# Patient Record
Sex: Female | Born: 1970 | Race: White | Hispanic: No | Marital: Married | Smoking: Current every day smoker
Health system: Southern US, Community
[De-identification: ages and names within clinical notes are randomized; demographics above are authoritative.]

## PROBLEM LIST (undated history)

## (undated) DIAGNOSIS — F419 Anxiety disorder, unspecified: Secondary | ICD-10-CM

## (undated) DIAGNOSIS — F32A Depression, unspecified: Secondary | ICD-10-CM

## (undated) DIAGNOSIS — E041 Nontoxic single thyroid nodule: Secondary | ICD-10-CM

## (undated) DIAGNOSIS — T4145XA Adverse effect of unspecified anesthetic, initial encounter: Secondary | ICD-10-CM

## (undated) DIAGNOSIS — F329 Major depressive disorder, single episode, unspecified: Secondary | ICD-10-CM

## (undated) DIAGNOSIS — K219 Gastro-esophageal reflux disease without esophagitis: Secondary | ICD-10-CM

## (undated) DIAGNOSIS — R9389 Abnormal findings on diagnostic imaging of other specified body structures: Secondary | ICD-10-CM

## (undated) DIAGNOSIS — K589 Irritable bowel syndrome without diarrhea: Secondary | ICD-10-CM

## (undated) DIAGNOSIS — T8859XA Other complications of anesthesia, initial encounter: Secondary | ICD-10-CM

## (undated) HISTORY — PX: TONSILLECTOMY AND ADENOIDECTOMY: SHX28

---
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
## 2007-05-12 ENCOUNTER — Observation Stay: Payer: Self-pay | Admitting: Obstetrics and Gynecology

## 2007-05-27 ENCOUNTER — Observation Stay: Payer: Self-pay | Admitting: Obstetrics and Gynecology

## 2007-06-23 ENCOUNTER — Observation Stay: Payer: Self-pay | Admitting: Obstetrics and Gynecology

## 2007-06-26 ENCOUNTER — Inpatient Hospital Stay: Payer: Self-pay | Admitting: Obstetrics and Gynecology

## 2010-12-25 HISTORY — PX: CHOLECYSTECTOMY: SHX55

## 2012-06-08 ENCOUNTER — Ambulatory Visit: Payer: Self-pay | Admitting: Family Medicine

## 2016-05-15 ENCOUNTER — Emergency Department: Payer: BLUE CROSS/BLUE SHIELD

## 2016-05-15 ENCOUNTER — Ambulatory Visit
Admission: EM | Admit: 2016-05-15 | Discharge: 2016-05-15 | Payer: BLUE CROSS/BLUE SHIELD | Attending: Family Medicine | Admitting: Family Medicine

## 2016-05-15 ENCOUNTER — Encounter: Payer: Self-pay | Admitting: *Deleted

## 2016-05-15 ENCOUNTER — Encounter: Payer: Self-pay | Admitting: Radiology

## 2016-05-15 ENCOUNTER — Emergency Department
Admission: EM | Admit: 2016-05-15 | Discharge: 2016-05-15 | Disposition: A | Discharge status: Home / Self Care | Payer: BLUE CROSS/BLUE SHIELD | Attending: Emergency Medicine | Admitting: Emergency Medicine

## 2016-05-15 DIAGNOSIS — R519 Headache, unspecified: Secondary | ICD-10-CM

## 2016-05-15 DIAGNOSIS — F17200 Nicotine dependence, unspecified, uncomplicated: Secondary | ICD-10-CM | POA: Diagnosis not present

## 2016-05-15 DIAGNOSIS — R079 Chest pain, unspecified: Secondary | ICD-10-CM | POA: Insufficient documentation

## 2016-05-15 DIAGNOSIS — R51 Headache: Secondary | ICD-10-CM | POA: Diagnosis not present

## 2016-05-15 DIAGNOSIS — F419 Anxiety disorder, unspecified: Secondary | ICD-10-CM | POA: Diagnosis not present

## 2016-05-15 DIAGNOSIS — F329 Major depressive disorder, single episode, unspecified: Secondary | ICD-10-CM | POA: Insufficient documentation

## 2016-05-15 DIAGNOSIS — Z8249 Family history of ischemic heart disease and other diseases of the circulatory system: Secondary | ICD-10-CM | POA: Diagnosis not present

## 2016-05-15 DIAGNOSIS — R42 Dizziness and giddiness: Secondary | ICD-10-CM | POA: Diagnosis present

## 2016-05-15 DIAGNOSIS — K589 Irritable bowel syndrome without diarrhea: Secondary | ICD-10-CM | POA: Diagnosis not present

## 2016-05-15 HISTORY — DX: Anxiety disorder, unspecified: F41.9

## 2016-05-15 HISTORY — DX: Irritable bowel syndrome, unspecified: K58.9

## 2016-05-15 HISTORY — DX: Major depressive disorder, single episode, unspecified: F32.9

## 2016-05-15 HISTORY — DX: Depression, unspecified: F32.A

## 2016-05-15 LAB — BASIC METABOLIC PANEL
Anion gap: 7 (ref 5–15)
BUN: 21 mg/dL — ABNORMAL HIGH (ref 6–20)
CO2: 26 mmol/L (ref 22–32)
Calcium: 8.9 mg/dL (ref 8.9–10.3)
Chloride: 103 mmol/L (ref 101–111)
Creatinine, Ser: 0.9 mg/dL (ref 0.44–1.00)
GFR calc Af Amer: >60 mL/min (ref >60)
GFR calc non Af Amer: >60 mL/min (ref >60)
Glucose, Bld: 92 mg/dL (ref 65–99)
Potassium: 3.8 mmol/L (ref 3.5–5.1)
Sodium: 136 mmol/L (ref 135–145)

## 2016-05-15 LAB — CBC
HCT: 41.7 % (ref 35.0–47.0)
Hemoglobin: 13.7 g/dL (ref 12.0–16.0)
MCH: 27.5 pg (ref 26.0–34.0)
MCHC: 32.8 g/dL (ref 32.0–36.0)
MCV: 83.8 fL (ref 80.0–100.0)
Platelets: 211 10*3/uL (ref 150–440)
RBC: 4.98 MIL/uL (ref 3.80–5.20)
RDW: 15.2 % — ABNORMAL HIGH (ref 11.5–14.5)
WBC: 10.9 10*3/uL (ref 3.6–11.0)

## 2016-05-15 LAB — TROPONIN I: Troponin I: <0.03 ng/mL (ref <0.031)

## 2016-05-15 MED ORDER — ASPIRIN 81 MG PO CHEW
324.0000 mg | CHEWABLE_TABLET | Freq: Once | ORAL | Status: AC
  Administered 2016-05-15: 324 mg via ORAL

## 2016-05-15 MED ORDER — IOPAMIDOL (ISOVUE-370) INJECTION 76%
75.0000 mL | Freq: Once | INTRAVENOUS | Status: AC | PRN
  Administered 2016-05-15: 75 mL via INTRAVENOUS
  Filled 2016-05-15: qty 75

## 2016-05-15 MED ORDER — IOPAMIDOL (ISOVUE-300) INJECTION 61%
75.0000 mL | Freq: Once | INTRAVENOUS | Status: DC | PRN
Start: 2016-05-15 — End: 2016-05-16
  Filled 2016-05-15: qty 75

## 2016-05-15 NOTE — ED Notes (Signed)
Pt reports having an episode today that included: chest pain, feet swelling, diaphoresis, and lightheadedness. Pt went to Urgent care, provided with EKG and 4 Aspirin, sent to ED. Pt states she had another episode that was less intense last Thursday. Denies medical Hx.

## 2016-05-15 NOTE — Discharge Instructions (Signed)
You have been seen in the emergency department today for a headache and chest pain. He'll workup today has shown largely normal results. As we discussed the CT of the head shows a possible very small aneurysm. Please bring the CT report to your primary care physician so that they may arrange follow-up if deemed necessary. Your workup for chest pain today has shown normal results however given a strong family history we have arranged for you to be seen by a cardiologist at 9:00 tomorrow morning. Please show up 15 minutes prior to this appointment. Return to the emergency department for any further headaches, or any further concerning chest pain.  Nonspecific Chest Pain It is often hard to find the cause of chest pain. There is always a chance that your pain could be related to something serious, such as a heart attack or a blood clot in your lungs. Chest pain can also be caused by conditions that are not life-threatening. If you have chest pain, it is very important to follow up with your doctor.  HOME CARE  If you were prescribed an antibiotic medicine, finish it all even if you start to feel better.  Avoid any activities that cause chest pain.  Do not use any tobacco products, including cigarettes, chewing tobacco, or electronic cigarettes. If you need help quitting, ask your doctor.  Do not drink alcohol.  Take medicines only as told by your doctor.  Keep all follow-up visits as told by your doctor. This is important. This includes any further testing if your chest pain does not go away.  Your doctor may tell you to keep your head raised (elevated) while you sleep.  Make lifestyle changes as told by your doctor. These may include:  Getting regular exercise. Ask your doctor to suggest some activities that are safe for you.  Eating a heart-healthy diet. Your doctor or a diet specialist (dietitian) can help you to learn healthy eating options.  Maintaining a healthy weight.  Managing  diabetes, if necessary.  Reducing stress. GET HELP IF:  Your chest pain does not go away, even after treatment.  You have a rash with blisters on your chest.  You have a fever. GET HELP RIGHT AWAY IF:  Your chest pain is worse.  You have an increasing cough, or you cough up blood.  You have severe belly (abdominal) pain.  You feel extremely weak.  You pass out (faint).  You have chills.  You have sudden, unexplained chest discomfort.  You have sudden, unexplained discomfort in your arms, back, neck, or jaw.  You have shortness of breath at any time.  You suddenly start to sweat, or your skin gets clammy.  You feel nauseous.  You vomit.  You suddenly feel light-headed or dizzy.  Your heart begins to beat quickly, or it feels like it is skipping beats. These symptoms may be an emergency. Do not wait to see if the symptoms will go away. Get medical help right away. Call your local emergency services (911 in the [place] Do not drive yourself to the hospital.   This information is not intended to replace advice given to you by your health care provider. Make sure you discuss any questions you have with your health care provider.   Document Released: 05/29/2008 Document Revised: 01/01/2015 Document Reviewed: 07/17/2014 Elsevier Interactive Patient Education 2016 Elsevier Inc.  General Headache Without Cause A headache is pain or discomfort felt around the head or neck area. The specific cause of a headache may  not be found. There are many causes and types of headaches. A few common ones are:  Tension headaches.  Migraine headaches.  Cluster headaches.  Chronic daily headaches. HOME CARE INSTRUCTIONS  Watch your condition for any changes. Take these steps to help with your condition: Managing Pain  Take over-the-counter and prescription medicines only as told by your health care provider.  Lie down in a dark, quiet room when you have a headache.  If directed,  apply ice to the head and neck area:  Put ice in a plastic bag.  Place a towel between your skin and the bag.  Leave the ice on for 20 minutes, 2-3 times per day.  Use a heating pad or hot shower to apply heat to the head and neck area as told by your health care provider.  Keep lights dim if bright lights bother you or make your headaches worse. Eating and Drinking  Eat meals on a regular schedule.  Limit alcohol use.  Decrease the amount of caffeine you drink, or stop drinking caffeine. General Instructions  Keep all follow-up visits as told by your health care provider. This is important.  Keep a headache journal to help find out what may trigger your headaches. For example, write down:  What you eat and drink.  How much sleep you get.  Any change to your diet or medicines.  Try massage or other relaxation techniques.  Limit stress.  Sit up straight, and do not tense your muscles.  Do not use tobacco products, including cigarettes, chewing tobacco, or e-cigarettes. If you need help quitting, ask your health care provider.  Exercise regularly as told by your health care provider.  Sleep on a regular schedule. Get 7-9 hours of sleep, or the amount recommended by your health care provider. SEEK MEDICAL CARE IF:   Your symptoms are not helped by medicine.  You have a headache that is different from the usual headache.  You have nausea or you vomit.  You have a fever. SEEK IMMEDIATE MEDICAL CARE IF:   Your headache becomes severe.  You have repeated vomiting.  You have a stiff neck.  You have a loss of vision.  You have problems with speech.  You have pain in the eye or ear.  You have muscular weakness or loss of muscle control.  You lose your balance or have trouble walking.  You feel faint or pass out.  You have confusion.   This information is not intended to replace advice given to you by your health care provider. Make sure you discuss any  questions you have with your health care provider.   Document Released: 12/11/2005 Document Revised: 09/01/2015 Document Reviewed: 04/05/2015 Elsevier Interactive Patient Education [employer] Elsevier Inc.

## 2016-05-15 NOTE — ED Notes (Signed)
Patient started having headache, chest pain, and dizziness today at 1215. The chest pain has resolved but the headache lingers. Patient still feels lightheaded.

## 2016-05-15 NOTE — ED Notes (Addendum)
Pt in with co chest burning this morning, states was diaphoretic.  Not having burning feeling now but does feel weakness and dizziness. Pt was given 81 mg x4 given at [facility].

## 2016-05-15 NOTE — ED Provider Notes (Signed)
CSN: [id number]     Arrival date & time 05/15/16  1701 History   First MD Initiated Contact with Patient 05/15/16 1741    Nurses notes were reviewed. Chief Complaint  Patient presents with  . Headache  . Chest Pain  . Dizziness   Patient presents with a history of chest pain that started about 12:00 this afternoon. The pain lasted several minutes she states she was diaphoretic and sweaty weak and lightheaded and dizzy. She. She is doing at work feet up and just took it out through the pain. After several minutes the pain went away but since then she has not felt right. She reports having a headache shortness of breath and is feeling overall weak. She states she feels weak like which she felt when she was pregnant. She states that the pain is better now but the weakness is still remaining. Should be noted the patient has a past history of normal EKGs but family medical history is very strong for heart disease. She has a brother has cardiomyopathy another cousin of mother's side who has had 3 MIs multiple members of her mother family with multiple MIs in their early 4940s both the cousin and brother are about her age as well.  Unfortunately she does smoke and she reports that since Friday she's had swelling of her legs she is not been able to explain. She denies any chest wall tenderness she doesn't denies having cough.   She's never had a stress test for cardiac evaluation for the past. She has 2 biological children survived the third one about 2 years ago timing context    (Consider location/radiation/quality/duration/timing/severity/associated sxs/prior Treatment) Patient is a 45 y.o. female presenting with headaches, chest pain, and dizziness. The history is provided by the patient and the spouse.  Headache Pain location:  Generalized Associated symptoms: dizziness, numbness and weakness   Chest Pain Pain location:  Substernal area Pain quality: aching, crushing and pressure   Pain quality:  not radiating   Pain radiates to:  Does not radiate Pain radiates to the back: no   Pain severity:  Severe Onset quality:  Sudden Progression:  Partially resolved Chronicity:  New Context: at rest   Context: not breathing, no drug use, not eating, no intercourse, not lifting, no movement and not raising an arm   Relieved by:  Nothing Associated symptoms: dizziness, headache, numbness, PND and weakness   Dizziness Associated symptoms: chest pain, headaches and weakness     Past Medical History  Diagnosis Date  . IBS (irritable bowel syndrome)   . Depression   . Anxiety    Past Surgical History  Procedure Laterality Date  . Tonsillectomy and adenoidectomy    . Cholecystectomy     History reviewed. No pertinent family history. Social History  Substance Use Topics  . Smoking status: Current Every Day Smoker  . Smokeless tobacco: Never Used  . Alcohol Use: No   OB History    No data available     Review of Systems  Cardiovascular: Positive for chest pain and PND.  Musculoskeletal: Positive for joint swelling and arthralgias.  Neurological: Positive for dizziness, weakness, numbness and headaches.  All other systems reviewed and are negative.   Allergies  Diflucan  Home Medications   Prior to Admission medications   Medication Sig Start Date End Date Taking? Authorizing Provider  buPROPion (WELLBUTRIN XL) 150 MG 24 hr tablet Take 150 mg by mouth daily.   Yes Historical Provider, MD  hyoscyamine (LEVSIN) 0.125  MG/5ML ELIX Take 0.125 mg by mouth.   Yes Historical Provider, MD   Meds Ordered and Administered this Visit   Medications  aspirin chewable tablet 324 mg (324 mg Oral Given 05/15/16 1801)    BP 109/66 mmHg  Pulse 85  Temp(Src) 98.2 F (36.8 C) (Oral)  Resp 18  Ht  (1.6 m)  Wt 180 lb (81.647 kg)  BMI 31.89 kg/m2  SpO2 100%  LMP 04/22/2016 No data found.   Physical Exam  Constitutional: She is oriented to person, place, and time. She appears  well-developed and well-nourished.  HENT:  Head: Normocephalic and atraumatic.  Right Ear: External ear normal.  Left Ear: External ear normal.  Mouth/Throat: Oropharynx is clear and moist.  Eyes: Conjunctivae are normal. Pupils are equal, round, and reactive to light.  Neck: Normal range of motion. Neck supple.  Cardiovascular: Normal rate, regular rhythm and normal heart sounds.   Pulmonary/Chest: Effort normal and breath sounds normal. She exhibits no tenderness and no bony tenderness.  Patient with no tenderness to palpation over her chest wall  Abdominal: Soft. Bowel sounds are normal. She exhibits no distension. There is no tenderness.  Musculoskeletal: Normal range of motion. She exhibits no tenderness.  Lymphadenopathy:    She has no cervical adenopathy.  Neurological: She is alert and oriented to person, place, and time.  Skin: Skin is warm and dry.  Vitals reviewed.   ED Course  Procedures (including critical care time)  Labs Review Labs Reviewed - No data to display  Imaging Review No results found.   Visual Acuity Review  Right Eye Distance:   Left Eye Distance:   Bilateral Distance:    Right Eye Near:   Left Eye Near:    Bilateral Near:         MDM   1. Chest pain, unspecified chest pain type    Discussed with charge nurse Tammy Sours at [facility] ED that the patient should get serial cardiac enzymes at least be admitted just because of her family history and her smoking history. Patient requests that her husband take her to [facility] regional and not by EMS. 4 baby aspirin given here and because she does have normal sinus rhythm on EKG will honor her request. A stress to them that Danella Deis do need to go to the ED for evaluation now Patient also reports increased swelling or leg since Friday off and on.  ED ECG REPORT I, Glendel Jaggers H, the attending physician, personally viewed and interpreted this ECG.   Date: 05/15/2016  EKG Time:17:45:58  Rate: 83  Rhythm:  normal EKG, normal sinus rhythm, there are no previous tracings available for comparison  Axis: 68  Intervals:none  ST&T Change: Normal EKG  Note: This dictation was prepared with Dragon dictation along with smaller phrase technology. Any transcriptional errors that result from this process are unintentional.     Hassan Rowan, MD 05/15/16 1818

## 2016-05-15 NOTE — ED Provider Notes (Signed)
[facility] Emergency Department Provider Note  Time seen: 8:56 PM  I have reviewed the triage vital signs and the nursing notes.   HISTORY  Chief Complaint Chest Pain    HPI Brandi Dominguez is a 45 y.o. female with a past medical history of IBS, depression, presents the emergency department with sudden onset of chest pain and headache. According to the patient around 12 PM today she had a sudden onset of a severe 8/10 occipital headache along with chest pain and diaphoresis. Patient states the headache lasted approximately 10 minutes, the chest pain lasted even less and then resolved. She states she has never had anything like this recently. She denies any current headache or chest pain but states throughout the day today she has continued to feel intermittently dizzy and lightheaded. Describes the chest pain is moderate when it occurred, but resolved within several minutes. Denies any nausea or shortness of breath at any point. Patient has noticed intermittent swelling of her lower extremities over the past one week, but denies any currently. Denies any leg pain.     Past Medical History  Diagnosis Date  . IBS (irritable bowel syndrome)   . Depression   . Anxiety     There are no active problems to display for this patient.   Past Surgical History  Procedure Laterality Date  . Tonsillectomy and adenoidectomy    . Cholecystectomy      Current Outpatient Rx  Name  Route  Sig  Dispense  Refill  . buPROPion (WELLBUTRIN XL) 150 MG 24 hr tablet   Oral   Take 150 mg by mouth daily.         . hyoscyamine (LEVSIN) 0.125 MG/5ML ELIX   Oral   Take 0.125 mg by mouth.           Allergies Diflucan  No family history on file.  Social History Social History  Substance Use Topics  . Smoking status: Current Every Day Smoker  . Smokeless tobacco: Never Used  . Alcohol Use: No    Review of Systems Constitutional: Negative for fever Cardiovascular:  Chest pain which is now resolved. Respiratory: Negative for shortness of breath. Gastrointestinal: Negative for abdominal pain, vomiting and diarrhea. Neurological: Positive for headache which has resolved. Denies focal weakness or numbness. 10-point ROS otherwise negative.  ____________________________________________   PHYSICAL EXAM:  VITAL SIGNS: ED Triage Vitals  Enc Vitals Group     BP 05/15/16 1904 114/85 mmHg     Pulse Rate 05/15/16 1904 86     Resp 05/15/16 1904 18     Temp 05/15/16 1904 98.2 F (36.8 C)     Temp Source 05/15/16 1904 Oral     SpO2 05/15/16 1904 100 %     Weight 05/15/16 1904 180 lb (81.647 kg)     Height 05/15/16 1904  (1.6 m)     Head Cir --      Peak Flow --      Pain Score 05/15/16 1904 0     Pain Loc --      Pain Edu? --      Excl. in GC? --     Constitutional: Alert and oriented. Well appearing and in no distress. Eyes: Normal exam ENT   Head: Normocephalic and atraumatic.   Mouth/Throat: Mucous membranes are moist. Cardiovascular: Normal rate, regular rhythm. No murmur Respiratory: Normal respiratory effort without tachypnea nor retractions. Breath sounds are clear  Gastrointestinal: Soft and nontender. No distention.  Musculoskeletal:  Nontender with normal range of motion in all extremities. Neurologic:  Normal speech and language. No gross focal neurologic deficits Skin:  Skin is warm, dry and intact.  Psychiatric: Mood and affect are normal.  ____________________________________________    EKG  EKG reviewed and interpreted, so shows normal sinus rhythm at 78 bpm, narrow QRS, normal axis, normal intervals, no ST changes. Normal EKG.  ____________________________________________    RADIOLOGY  CT angiography shows one small possible aneurysm.   ____________________________________________   INITIAL IMPRESSION / ASSESSMENT AND PLAN / ED COURSE  Pertinent labs & imaging results that were available during my care of  the patient were reviewed by me and considered in my medical decision making (see chart for details).  Patient presents the emergency department with chest pain and significant headache at 12 PM which have both resolved. Patient denies any complaints at this time besides intermittent dizziness and foggy headed feelings today. Patient's labs are normal including negative troponin drawn approximately 8 hours after the event. Patient does have a significant family history of cardiac disease. Her EKG is reassuring, troponin is negative. However given her significant family history of discussed the patient with our on-call cardiologist Dr. Welton FlakesKhan, who will see her in the office at 9 AM tomorrow morning for a stress test. Given the patient's significant headache along with diaphoresis, I believe the patient would benefit from a CT angiography of the head to help rule out aneurysm. Patient denies any current headache, but I believe a CTA would help us rule out a sentinel event.  CT angiography does show one small area approximately 2 mm possible aneurysm, no bleed visualized. Overall the patient appears well. States for the past a since her son died unexpectedly she has been having intermittent anxiety-like episodes which she believes this could have been although states she has never had a headache with her anxiety. At this point given a very small possible aneurysm on CT angiogram I do not believe this is because of the patient's pain. She continues to be symptom free in the emergency department. I will provide a copy of the patient's CT report so that she may bring it to her primary care doctor to arrange follow-up if deemed necessary.  ____________________________________________   FINAL CLINICAL IMPRESSION(S) / ED DIAGNOSES  Headache Chest pain   Minna AntisKevin Jaceion Aday, MD 05/15/16 2239

## 2016-05-15 NOTE — Discharge Instructions (Signed)

## 2016-09-01 ENCOUNTER — Ambulatory Visit (INDEPENDENT_AMBULATORY_CARE_PROVIDER_SITE_OTHER): Payer: BLUE CROSS/BLUE SHIELD | Admitting: Internal Medicine

## 2016-09-01 ENCOUNTER — Encounter: Payer: Self-pay | Admitting: Internal Medicine

## 2016-09-01 VITALS — BP 122/80 | HR 102 | Resp 16 | Ht 63.0 in | Wt 183.0 lb

## 2016-09-01 DIAGNOSIS — K589 Irritable bowel syndrome without diarrhea: Secondary | ICD-10-CM | POA: Insufficient documentation

## 2016-09-01 DIAGNOSIS — K580 Irritable bowel syndrome with diarrhea: Secondary | ICD-10-CM

## 2016-09-01 DIAGNOSIS — K219 Gastro-esophageal reflux disease without esophagitis: Secondary | ICD-10-CM | POA: Diagnosis not present

## 2016-09-01 DIAGNOSIS — Z720 Tobacco use: Secondary | ICD-10-CM

## 2016-09-01 DIAGNOSIS — F329 Major depressive disorder, single episode, unspecified: Secondary | ICD-10-CM

## 2016-09-01 DIAGNOSIS — R9389 Abnormal findings on diagnostic imaging of other specified body structures: Secondary | ICD-10-CM

## 2016-09-01 DIAGNOSIS — F32A Depression, unspecified: Secondary | ICD-10-CM

## 2016-09-01 DIAGNOSIS — R938 Abnormal findings on diagnostic imaging of other specified body structures: Secondary | ICD-10-CM

## 2016-09-01 DIAGNOSIS — Z8711 Personal history of peptic ulcer disease: Secondary | ICD-10-CM | POA: Diagnosis not present

## 2016-09-01 DIAGNOSIS — F17200 Nicotine dependence, unspecified, uncomplicated: Secondary | ICD-10-CM

## 2016-09-01 HISTORY — DX: Abnormal findings on diagnostic imaging of other specified body structures: R93.89

## 2016-09-01 MED ORDER — DEXLANSOPRAZOLE 60 MG PO CPDR: 60.0000 mg | DELAYED_RELEASE_CAPSULE | Freq: Every day | ORAL | 1 refills | Status: DC

## 2016-09-01 MED ORDER — BUPROPION HCL ER (XL) 150 MG PO TB24: 300.0000 mg | ORAL_TABLET | Freq: Every day | ORAL | 0 refills | Status: DC

## 2016-09-01 MED ORDER — VENLAFAXINE HCL ER 75 MG PO CP24: 75.0000 mg | ORAL_CAPSULE | Freq: Every day | ORAL | 1 refills | Status: DC

## 2016-09-01 MED ORDER — HYOSCYAMINE SULFATE 0.125 MG PO TABS: 0.1250 mg | ORAL_TABLET | ORAL | 1 refills | Status: DC | PRN

## 2016-09-01 MED ORDER — DIPHENOXYLATE-ATROPINE 2.5-0.025 MG PO TABS: 1.0000 | ORAL_TABLET | Freq: Four times a day (QID) | ORAL | 1 refills | Status: DC | PRN

## 2016-09-01 NOTE — Progress Notes (Signed)
Date:  09/01/2016   Name:  Brandi Dominguez   DOB:  [birth year]   MRN:  [id number]   Chief Complaint: Establish Care Previous patient of Dr. Marylu Lund dear Lauralee Evener. Over the past year and a half has not really had any close primary care follow-up. She has been seen in a therapist on a regular basis for the past 3 years.   Depression - patient has a long history of depression for a number of years. Worsened by the passing of 2 children in separate accidents. The last was 3 years ago. At various times she's been on Prozac, Celexa, and Wellbutrin. Has recently found a previous polyp Wellbutrin that she resumed 150 mg daily. She has also taken Xanax and clonazepam when necessary for panic attacks but does not like the side effects. She has considered seeing a psychiatrist as suggested by her therapist but is not quite ready to make that step at this time.   Irritable bowel syndrome with diarrhea - the symptoms started 3 years ago when her son died. She's been on Lomotil and Levsin several times per day as needed with good results. She has not seen gastroenterology recently.   History of peptic ulcer disease/reflux - she was diagnosed with peptic ulcers about 5 years ago and has been on proton pump inhibitor since then. She also has reflux that is well controlled with medication.  Abnormal CTA head - she reports an episode of severe head and chest pain lasting only a few seconds but so dramatic that she went to urgent care. This occurred about 2 months ago. 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 She's had a neurology consultation for some neurological symptoms and also a neurosurgical consultation. The plan at this time is to repeat a CTA in one year. Neurology felt that her other symptoms are probably related to stress and anxiety.  Perimenopausal symptoms - she states that her periods are becoming slightly irregular. She is also having some sweats at night. She's been  taking an on known over-the-counter supplement with minimal benefit.   Review of Systems  Constitutional: Negative for chills, diaphoresis, fever and unexpected weight change.  Respiratory: Positive for cough and shortness of breath. Negative for chest tightness and wheezing.   Cardiovascular: Negative for chest pain, palpitations and leg swelling.  Gastrointestinal: Positive for abdominal pain and diarrhea.  Genitourinary: Positive for menstrual problem (irregular, some night sweats). Negative for difficulty urinating.  Musculoskeletal: Negative for arthralgias.  Neurological: Negative for dizziness, seizures, syncope, speech difficulty, numbness and headaches.  Hematological: Negative for adenopathy. Does not bruise/bleed easily.  Psychiatric/Behavioral: Positive for dysphoric mood and sleep disturbance. The patient is nervous/anxious.     Patient Active Problem List   Diagnosis Date Noted  . Irritable bowel syndrome 09/01/2016  . Depression 09/01/2016    Prior to Admission medications   Medication Sig Start Date End Date Taking? Authorizing Provider  ALPRAZolam Prudy Feeler) 0.5 MG tablet Take by mouth. 07/20/10  Yes Historical Provider, MD  buPROPion (WELLBUTRIN XL) 150 MG 24 hr tablet Take 150 mg by mouth daily.   Yes Historical Provider, MD  dexlansoprazole (DEXILANT) 60 MG capsule Take by mouth. 07/20/10  Yes Historical Provider, MD  diphenoxylate-atropine (LOMOTIL) 2.5-0.025 MG/5ML liquid Take by mouth 4 (four) times daily as needed for diarrhea or loose stools.   Yes Historical Provider, MD  hyoscyamine (LEVSIN) 0.125 MG/5ML ELIX Take 0.125 mg by mouth.   Yes Historical Provider, MD  Allergies  Allergen Reactions  . Diflucan [Fluconazole] Anaphylaxis    Past Surgical History:  Procedure Laterality Date  . CHOLECYSTECTOMY    . TONSILLECTOMY AND ADENOIDECTOMY      Social History  Substance Use Topics  . Smoking status: Current Every Day Smoker    Packs/day: 1.00     Types: Cigarettes  . Smokeless tobacco: Never Used  . Alcohol use No     Medication list has been reviewed and updated.   Physical Exam  Constitutional: She is oriented to person, place, and time. She appears well-developed and well-nourished. No distress.  HENT:  Head: Normocephalic and atraumatic.  Neck: Normal range of motion. Neck supple. Carotid bruit is not present.  Cardiovascular: Normal rate, regular rhythm and normal heart sounds.   Pulmonary/Chest: Effort normal and breath sounds normal. No respiratory distress.  Abdominal: Soft. Bowel sounds are normal. She exhibits no distension and no mass. There is no tenderness. There is no rebound and no guarding.  Musculoskeletal: Normal range of motion. She exhibits no edema or tenderness.  Neurological: She is alert and oriented to person, place, and time.  Skin: Skin is warm and dry. No rash noted.  Psychiatric: Her behavior is normal. Thought content normal. Her mood appears anxious. She exhibits a depressed mood.  Nursing note and vitals reviewed.   BP 122/80   Pulse (!) 102   Resp 16   Ht 5\' 3"  (1.6 m)   Wt 183 lb (83 kg)   LMP 08/17/2016   SpO2 98%   BMI 32.42 kg/m   Assessment and Plan: 1. Abnormal computed tomography angiography (CTA) Plan follow up with Neurosurgery in one year  2. Smoker - Nurse to provide smoking / tobacco cessation education  3. Personal history of peptic ulcer disease Continue PPI  4. Gastroesophageal reflux disease without esophagitis Continue PPI - dexlansoprazole (DEXILANT) 60 MG capsule; Take 1 capsule (60 mg total) by mouth daily.  Dispense: 90 capsule; Refill: 1  5. Depression Consider higher dose bupropion Add Venlafaxine Recommend continue with Therapy and consult Psychiatry - buPROPion (WELLBUTRIN XL) 150 MG 24 hr tablet; Take 2 tablets (300 mg total) by mouth daily.  Dispense: 60 tablet; Refill: 0 - venlafaxine XR (EFFEXOR-XR) 75 MG 24 hr capsule; Take 1 capsule (75 mg  total) by mouth daily with breakfast.  Dispense: 30 capsule; Refill: 1  6. Irritable bowel syndrome with diarrhea - hyoscyamine (LEVSIN, ANASPAZ) 0.125 MG tablet; Take 1 tablet (0.125 mg total) by mouth every 4 (four) hours as needed.  Dispense: 360 tablet; Refill: 1 - diphenoxylate-atropine (LOMOTIL) 2.5-0.025 MG tablet; Take 1 tablet by mouth 4 (four) times daily as needed for diarrhea or loose stools.  Dispense: 120 tablet; Refill: 1   Bari EdwardLaura Tom Macpherson, MD [facility]  09/01/2016

## 2016-09-08 ENCOUNTER — Telehealth: Payer: Self-pay | Admitting: Internal Medicine

## 2016-09-08 NOTE — Telephone Encounter (Signed)
Patient started Effexor last week.  Now having dizziness, shaking and heart racing.

## 2016-09-08 NOTE — Telephone Encounter (Signed)
Advised 

## 2016-09-22 ENCOUNTER — Other Ambulatory Visit: Payer: Self-pay | Admitting: Internal Medicine

## 2016-09-22 ENCOUNTER — Ambulatory Visit (INDEPENDENT_AMBULATORY_CARE_PROVIDER_SITE_OTHER): Payer: BLUE CROSS/BLUE SHIELD | Admitting: Internal Medicine

## 2016-09-22 ENCOUNTER — Encounter: Payer: Self-pay | Admitting: Internal Medicine

## 2016-09-22 DIAGNOSIS — K219 Gastro-esophageal reflux disease without esophagitis: Secondary | ICD-10-CM

## 2016-09-22 DIAGNOSIS — F329 Major depressive disorder, single episode, unspecified: Secondary | ICD-10-CM

## 2016-09-22 DIAGNOSIS — F32A Depression, unspecified: Secondary | ICD-10-CM

## 2016-09-22 MED ORDER — DULOXETINE HCL 60 MG PO CPEP: 60.0000 mg | ORAL_CAPSULE | Freq: Every day | ORAL | 2 refills | Status: DC

## 2016-09-22 MED ORDER — BUPROPION HCL ER (XL) 150 MG PO TB24: 300.0000 mg | ORAL_TABLET | Freq: Every day | ORAL | 0 refills | Status: DC

## 2016-09-22 MED ORDER — DEXLANSOPRAZOLE 60 MG PO CPDR: 60.0000 mg | DELAYED_RELEASE_CAPSULE | Freq: Every day | ORAL | 0 refills | Status: DC

## 2016-09-22 NOTE — Progress Notes (Signed)
Date:  09/22/2016   Name:  Brandi Dominguez   DOB:  April 23, 1971   MRN:  [id number]   Chief Complaint: Depression Depression         This is a chronic problem.  The current episode started more than 1 year ago.   Associated symptoms include decreased concentration, irritable and decreased interest.  Associated symptoms include no fatigue, no myalgias and no headaches. She doing well on Wellbutrin 300 mg per day but did not tolerate Effexor.  Effexor helped her obsessive thoughts but caused rapid heart beat and shakes.  She has not done well on SSRIs in the past.  Has never taken other SNRIs.  PUD/GERD - doing well on Dexilant.  Needs a mail order refill for one capsule per day.  Review of Systems  Constitutional: Negative for chills, fatigue and fever.  Respiratory: Negative for choking, shortness of breath and wheezing.   Cardiovascular: Negative for chest pain and palpitations.  Gastrointestinal: Negative for abdominal pain, diarrhea and vomiting.  Musculoskeletal: Negative for arthralgias, joint swelling and myalgias.  Neurological: Negative for dizziness and headaches.  Psychiatric/Behavioral: Positive for decreased concentration and depression.    Patient Active Problem List   Diagnosis Date Noted  . Irritable bowel syndrome 09/01/2016  . Depression 09/01/2016  . Abnormal computed tomography angiography (CTA) 09/01/2016  . Personal history of peptic ulcer disease 09/01/2016  . GERD (gastroesophageal reflux disease) 09/01/2016    Prior to Admission medications   Medication Sig Start Date End Date Taking? Authorizing Provider  ALPRAZolam Prudy Feeler) 0.5 MG tablet Take by mouth. 07/20/10  Yes Historical Provider, MD  buPROPion (WELLBUTRIN XL) 150 MG 24 hr tablet Take 2 tablets (300 mg total) by mouth daily. 09/01/16  Yes Reubin Milan, MD  dexlansoprazole (DEXILANT) 60 MG capsule Take 1 capsule (60 mg total) by mouth daily. 09/01/16  Yes Reubin Milan, MD  diphenoxylate-atropine  (LOMOTIL) 2.5-0.025 MG tablet Take 1 tablet by mouth 4 (four) times daily as needed for diarrhea or loose stools. 09/01/16  Yes Reubin Milan, MD  hyoscyamine (LEVSIN, ANASPAZ) 0.125 MG tablet Take 1 tablet (0.125 mg total) by mouth every 4 (four) hours as needed. 09/01/16  Yes Reubin Milan, MD    Allergies  Allergen Reactions  . Diflucan [Fluconazole] Anaphylaxis  . Venlafaxine Anxiety and Palpitations    Past Surgical History:  Procedure Laterality Date  . CHOLECYSTECTOMY    . TONSILLECTOMY AND ADENOIDECTOMY      Social History  Substance Use Topics  . Smoking status: Current Every Day Smoker    Packs/day: 0.50    Types: Cigarettes  . Smokeless tobacco: Never Used  . Alcohol use No     Medication list has been reviewed and updated.   Physical Exam  Constitutional: She is oriented to person, place, and time. She appears well-developed and well-nourished. She is irritable. No distress.  HENT:  Head: Normocephalic and atraumatic.  Neck: Normal range of motion. Neck supple. Carotid bruit is not present.  Cardiovascular: Normal rate, regular rhythm and normal heart sounds.   Pulmonary/Chest: Effort normal and breath sounds normal. No respiratory distress.  Abdominal: Soft. Bowel sounds are normal. She exhibits no distension and no mass. There is no tenderness. There is no rebound and no guarding.  Musculoskeletal: Normal range of motion. She exhibits no edema or tenderness.  Neurological: She is alert and oriented to person, place, and time.  Skin: Skin is warm and dry. No rash noted.  Psychiatric: Her speech  is normal and behavior is normal. Thought content normal. Her mood appears not anxious. She does not exhibit a depressed mood.  Nursing note and vitals reviewed.   BP 117/78   Pulse 94   Resp 16   Ht 5\' 3"  (1.6 m)   Wt 195 lb (88.5 kg)   LMP 08/17/2016   SpO2 97%   BMI 34.54 kg/m   Assessment and Plan: 1. Depression Continue Wellbutrin; add another  SNRI Call for mail order Rx if helpful - buPROPion (WELLBUTRIN XL) 150 MG 24 hr tablet; Take 2 tablets (300 mg total) by mouth daily.  Dispense: 180 tablet; Refill: 0 - DULoxetine (CYMBALTA) 60 MG capsule; Take 1 capsule (60 mg total) by mouth daily.  Dispense: 30 capsule; Refill: 2  2. Gastroesophageal reflux disease without esophagitis Stable on chronic PPI - dexlansoprazole (DEXILANT) 60 MG capsule; Take 1 capsule (60 mg total) by mouth daily.  Dispense: 90 capsule; Refill: 0   Bari EdwardLaura Janaye Corp, MD [facility]  09/22/2016

## 2016-09-29 ENCOUNTER — Ambulatory Visit: Payer: BLUE CROSS/BLUE SHIELD | Admitting: Internal Medicine

## 2016-10-03 ENCOUNTER — Encounter: Payer: Self-pay | Admitting: Internal Medicine

## 2016-10-11 ENCOUNTER — Encounter: Payer: Self-pay | Admitting: Internal Medicine

## 2016-10-11 ENCOUNTER — Other Ambulatory Visit: Payer: Self-pay | Admitting: Internal Medicine

## 2016-10-11 DIAGNOSIS — F320 Major depressive disorder, single episode, mild: Secondary | ICD-10-CM

## 2016-10-11 MED ORDER — DULOXETINE HCL 60 MG PO CPEP: 60.0000 mg | ORAL_CAPSULE | Freq: Every day | ORAL | 1 refills | Status: DC

## 2016-10-20 ENCOUNTER — Ambulatory Visit (INDEPENDENT_AMBULATORY_CARE_PROVIDER_SITE_OTHER): Payer: BLUE CROSS/BLUE SHIELD

## 2016-10-20 DIAGNOSIS — Z23 Encounter for immunization: Secondary | ICD-10-CM

## 2016-11-10 ENCOUNTER — Other Ambulatory Visit: Payer: Self-pay | Admitting: Internal Medicine

## 2016-11-10 DIAGNOSIS — F329 Major depressive disorder, single episode, unspecified: Secondary | ICD-10-CM

## 2016-11-10 DIAGNOSIS — F32A Depression, unspecified: Secondary | ICD-10-CM

## 2016-12-11 ENCOUNTER — Encounter: Payer: Self-pay | Admitting: Internal Medicine

## 2016-12-11 ENCOUNTER — Other Ambulatory Visit: Payer: Self-pay | Admitting: Internal Medicine

## 2016-12-11 DIAGNOSIS — F3341 Major depressive disorder, recurrent, in partial remission: Secondary | ICD-10-CM

## 2016-12-11 MED ORDER — BUPROPION HCL ER (XL) 150 MG PO TB24: 300.0000 mg | ORAL_TABLET | Freq: Every day | ORAL | 1 refills | Status: DC

## 2016-12-20 ENCOUNTER — Encounter: Payer: Self-pay | Admitting: Internal Medicine

## 2017-04-04 ENCOUNTER — Other Ambulatory Visit: Payer: Self-pay | Admitting: Internal Medicine

## 2017-04-04 ENCOUNTER — Encounter: Payer: Self-pay | Admitting: Internal Medicine

## 2017-04-04 DIAGNOSIS — F320 Major depressive disorder, single episode, mild: Secondary | ICD-10-CM

## 2017-04-04 DIAGNOSIS — F3341 Major depressive disorder, recurrent, in partial remission: Secondary | ICD-10-CM

## 2017-04-04 MED ORDER — DULOXETINE HCL 60 MG PO CPEP: 60.0000 mg | ORAL_CAPSULE | Freq: Every day | ORAL | 1 refills | Status: DC

## 2017-04-04 MED ORDER — BUPROPION HCL ER (XL) 150 MG PO TB24: 300.0000 mg | ORAL_TABLET | Freq: Every day | ORAL | 1 refills | Status: DC

## 2017-08-08 ENCOUNTER — Encounter: Payer: Self-pay | Admitting: Internal Medicine

## 2017-08-08 NOTE — Telephone Encounter (Signed)
MyChart msg

## 2017-08-13 ENCOUNTER — Encounter: Payer: Self-pay | Admitting: Internal Medicine

## 2017-08-13 ENCOUNTER — Ambulatory Visit (INDEPENDENT_AMBULATORY_CARE_PROVIDER_SITE_OTHER): Payer: BLUE CROSS/BLUE SHIELD | Admitting: Internal Medicine

## 2017-08-13 VITALS — BP 126/80 | HR 110 | Ht 63.0 in | Wt 205.0 lb

## 2017-08-13 DIAGNOSIS — M2550 Pain in unspecified joint: Secondary | ICD-10-CM | POA: Diagnosis not present

## 2017-08-13 DIAGNOSIS — K219 Gastro-esophageal reflux disease without esophagitis: Secondary | ICD-10-CM | POA: Diagnosis not present

## 2017-08-13 DIAGNOSIS — F320 Major depressive disorder, single episode, mild: Secondary | ICD-10-CM | POA: Diagnosis not present

## 2017-08-13 DIAGNOSIS — L564 Polymorphous light eruption: Secondary | ICD-10-CM | POA: Diagnosis not present

## 2017-08-13 DIAGNOSIS — K580 Irritable bowel syndrome with diarrhea: Secondary | ICD-10-CM

## 2017-08-13 MED ORDER — BUSPIRONE HCL 10 MG PO TABS: 5.0000 mg | ORAL_TABLET | Freq: Three times a day (TID) | ORAL | 1 refills | Status: DC

## 2017-08-13 MED ORDER — TRIAMCINOLONE ACETONIDE 0.1 % EX CREA: 1.0000 "application " | TOPICAL_CREAM | Freq: Two times a day (BID) | CUTANEOUS | 3 refills | Status: DC

## 2017-08-13 MED ORDER — DIPHENOXYLATE-ATROPINE 2.5-0.025 MG PO TABS: 1.0000 | ORAL_TABLET | Freq: Four times a day (QID) | ORAL | 1 refills | Status: DC | PRN

## 2017-08-13 MED ORDER — HYOSCYAMINE SULFATE 0.125 MG PO TABS: 0.1250 mg | ORAL_TABLET | ORAL | 1 refills | Status: DC | PRN

## 2017-08-13 MED ORDER — DEXLANSOPRAZOLE 60 MG PO CPDR: 60.0000 mg | DELAYED_RELEASE_CAPSULE | Freq: Every day | ORAL | 3 refills | Status: DC

## 2017-08-13 NOTE — Progress Notes (Signed)
Date:  08/13/2017   Name:  Brandi Dominguez   DOB:  [birth year]   MRN:  [id number]   Chief Complaint: Joint Pain (Recently, when in sun having painful flare ups. Its only after sun exposure. Have had 3 since June. Neck, knees, wrist, fingers, ankles, and toes. Hurts to move hands. Pain feels like throbbing and aching. Its like "someone it trying to insert something that is too big into my joints." )  Arthralgias - only after sun exposure.  Peripheral joints and neck felt swollen but looked normal after a weekend in sun. The joints remained stiff for 24 hours and improved with tylenol.  No redness, swelling or other visible abnormality.  Anxiety - patient has a long history of depression and anxiety a lot of panic attacks. She sees a therapist right away but not a psychiatrist. She's been on Wellbutrin. She's been on alprazolam for panic attack but does not like the way that medication makes her feel. She is wondering if she could try BuSpar.  Review of Systems  Constitutional: Negative for chills, fatigue and fever.  Respiratory: Negative for chest tightness and shortness of breath.   Cardiovascular: Negative for chest pain, palpitations and leg swelling.  Gastrointestinal: Positive for diarrhea (IBS). Negative for abdominal pain.  Musculoskeletal: Positive for arthralgias. Negative for gait problem, joint swelling and myalgias.  Skin: Positive for color change.  Allergic/Immunologic: Negative for environmental allergies.  Neurological: Negative for facial asymmetry and headaches.  Psychiatric/Behavioral: Positive for dysphoric mood and sleep disturbance. The patient is nervous/anxious.     Patient Active Problem List   Diagnosis Date Noted  . Mild single current episode of major depressive disorder (HCC) 10/11/2016  . Irritable bowel syndrome 09/01/2016  . Abnormal computed tomography angiography (CTA) 09/01/2016  . Personal history of peptic ulcer disease 09/01/2016  . GERD  (gastroesophageal reflux disease) 09/01/2016    Prior to Admission medications   Medication Sig Start Date End Date Taking? Authorizing Provider  buPROPion (WELLBUTRIN XL) 150 MG 24 hr tablet Take 2 tablets (300 mg total) by mouth daily. 04/04/17  Yes Reubin Milan, MD  dexlansoprazole (DEXILANT) 60 MG capsule Take 1 capsule (60 mg total) by mouth daily. 09/22/16  Yes Reubin Milan, MD  diphenoxylate-atropine (LOMOTIL) 2.5-0.025 MG tablet Take 1 tablet by mouth 4 (four) times daily as needed for diarrhea or loose stools. 09/01/16  Yes Reubin Milan, MD  hyoscyamine (LEVSIN, ANASPAZ) 0.125 MG tablet Take 1 tablet (0.125 mg total) by mouth every 4 (four) hours as needed. 09/01/16  Yes Reubin Milan, MD  ALPRAZolam Prudy Feeler) 0.5 MG tablet Take by mouth. 07/20/10   [provider]    Allergies  Allergen Reactions  . Diflucan [Fluconazole] Anaphylaxis  . Venlafaxine Anxiety and Palpitations    Past Surgical History:  Procedure Laterality Date  . CHOLECYSTECTOMY    . TONSILLECTOMY AND ADENOIDECTOMY      Social History  Substance Use Topics  . Smoking status: Current Every Day Smoker    Packs/day: 0.50    Types: Cigarettes  . Smokeless tobacco: Never Used  . Alcohol use No     Medication list has been reviewed and updated.   Physical Exam  Constitutional: She is oriented to person, place, and time. She appears well-developed. No distress.  HENT:  Head: Normocephalic and atraumatic.  Neck: Normal range of motion. Neck supple. No thyromegaly present.  Cardiovascular: Normal rate, regular rhythm and normal heart sounds.   Pulmonary/Chest: Effort normal and  breath sounds normal. No respiratory distress.  Musculoskeletal: Normal range of motion. She exhibits no edema, tenderness or deformity.  Hands, wrists, elbows, knees and ankles without tenderness, synovitis or deformity  Neurological: She is alert and oriented to person, place, and time.  Skin: Skin is warm and  dry. No rash noted.  Scattered < 1 cm lesions over chest, arms and legs that look like telangectasias; No vesicles or open lesions  Psychiatric: She has a normal mood and affect. Her behavior is normal. Thought content normal.  Nursing note and vitals reviewed.   BP 126/80   Pulse (!) 110   Ht 5\' 3"  (1.6 m)   Wt 205 lb (93 kg)   SpO2 99%   BMI 36.31 kg/m   Assessment and Plan: 1. Polymorphic light eruption Continue to avoid direct sun exposure - CBC with Differential/Platelet  2. Arthralgia of multiple joints Screen for rheumatologic disease - ANA w/Reflex if Positive - Sedimentation rate - Rheumatoid factor  3. Mild single current episode of major depressive disorder (HCC) Stop Xanax Begin buspar 5 mg tid - busPIRone (BUSPAR) 10 MG tablet; Take 0.5 tablets (5 mg total) by mouth 3 (three) times daily.  Dispense: 45 tablet; Refill: 1 - Thyroid Panel With TSH  4. Irritable bowel syndrome with diarrhea - hyoscyamine (LEVSIN, ANASPAZ) 0.125 MG tablet; Take 1 tablet (0.125 mg total) by mouth every 4 (four) hours as needed.  Dispense: 360 tablet; Refill: 1 - diphenoxylate-atropine (LOMOTIL) 2.5-0.025 MG tablet; Take 1 tablet by mouth 4 (four) times daily as needed for diarrhea or loose stools.  Dispense: 120 tablet; Refill: 1  5. Gastroesophageal reflux disease without esophagitis - dexlansoprazole (DEXILANT) 60 MG capsule; Take 1 capsule (60 mg total) by mouth daily.  Dispense: 90 capsule; Refill: 3   Meds ordered this encounter  Medications  . busPIRone (BUSPAR) 10 MG tablet    Sig: Take 0.5 tablets (5 mg total) by mouth 3 (three) times daily.    Dispense:  45 tablet    Refill:  1  . hyoscyamine (LEVSIN, ANASPAZ) 0.125 MG tablet    Sig: Take 1 tablet (0.125 mg total) by mouth every 4 (four) hours as needed.    Dispense:  360 tablet    Refill:  1  . diphenoxylate-atropine (LOMOTIL) 2.5-0.025 MG tablet    Sig: Take 1 tablet by mouth 4 (four) times daily as needed for  diarrhea or loose stools.    Dispense:  120 tablet    Refill:  1  . dexlansoprazole (DEXILANT) 60 MG capsule    Sig: Take 1 capsule (60 mg total) by mouth daily.    Dispense:  90 capsule    Refill:  3  . triamcinolone cream (KENALOG) 0.1 %    Sig: Apply 1 application topically 2 (two) times daily.    Dispense:  80 g    Refill:  3    Bari Edward, MD [facility]  08/13/2017

## 2017-08-18 LAB — CBC WITH DIFFERENTIAL/PLATELET
Basophils Absolute: 0.1 10*3/uL (ref 0.0–0.2)
Basos: 1 %
EOS (ABSOLUTE): 0.2 10*3/uL (ref 0.0–0.4)
Eos: 2 %
Hematocrit: 40.2 % (ref 34.0–46.6)
Hemoglobin: 13.4 g/dL (ref 11.1–15.9)
Immature Grans (Abs): 0 10*3/uL (ref 0.0–0.1)
Immature Granulocytes: 0 %
Lymphocytes Absolute: 3.3 10*3/uL — ABNORMAL HIGH (ref 0.7–3.1)
Lymphs: 34 %
MCH: 27.3 pg (ref 26.6–33.0)
MCHC: 33.3 g/dL (ref 31.5–35.7)
MCV: 82 fL (ref 79–97)
Monocytes Absolute: 0.6 10*3/uL (ref 0.1–0.9)
Monocytes: 6 %
Neutrophils Absolute: 5.5 10*3/uL (ref 1.4–7.0)
Neutrophils: 57 %
Platelets: 312 10*3/uL (ref 150–379)
RBC: 4.9 x10E6/uL (ref 3.77–5.28)
RDW: 14.9 % (ref 12.3–15.4)
WBC: 9.7 10*3/uL (ref 3.4–10.8)

## 2017-08-18 LAB — THYROID PANEL WITH TSH
Free Thyroxine Index: 1.9 (ref 1.2–4.9)
T3 Uptake Ratio: 27 % (ref 24–39)
T4, Total: 7.1 ug/dL (ref 4.5–12.0)
TSH: 1.71 u[IU]/mL (ref 0.450–4.500)

## 2017-08-18 LAB — SEDIMENTATION RATE: Sed Rate: 21 mm/hr (ref 0–32)

## 2017-08-18 LAB — RHEUMATOID FACTOR: Rhuematoid fact SerPl-aCnc: <10 IU/mL (ref 0.0–13.9)

## 2017-08-18 LAB — ANA W/REFLEX IF POSITIVE: Anti Nuclear Antibody(ANA): NEGATIVE

## 2017-08-20 ENCOUNTER — Other Ambulatory Visit: Payer: Self-pay | Admitting: Internal Medicine

## 2017-08-20 DIAGNOSIS — M25542 Pain in joints of left hand: Secondary | ICD-10-CM

## 2017-08-20 DIAGNOSIS — M25541 Pain in joints of right hand: Secondary | ICD-10-CM

## 2017-08-20 DIAGNOSIS — L564 Polymorphous light eruption: Secondary | ICD-10-CM

## 2017-08-20 NOTE — Progress Notes (Signed)
Patient stated she wants to wait to see if she has another flare up - if so then she will call for referral.

## 2017-08-20 NOTE — Progress Notes (Signed)
Patient agrees to referral being sent in to [facility] clinic,.

## 2017-08-22 ENCOUNTER — Encounter: Payer: Self-pay | Admitting: Internal Medicine

## 2017-08-22 ENCOUNTER — Other Ambulatory Visit: Payer: Self-pay | Admitting: Internal Medicine

## 2017-08-22 MED ORDER — PREDNISONE 10 MG PO TABS: ORAL_TABLET | ORAL | 0 refills | Status: DC

## 2017-08-22 NOTE — Telephone Encounter (Signed)
MyChart reply

## 2017-08-22 NOTE — Telephone Encounter (Signed)
Patient MyChart Message

## 2017-10-23 ENCOUNTER — Other Ambulatory Visit: Payer: Self-pay

## 2017-10-23 ENCOUNTER — Encounter: Payer: Self-pay | Admitting: Internal Medicine

## 2017-10-23 DIAGNOSIS — F320 Major depressive disorder, single episode, mild: Secondary | ICD-10-CM

## 2017-10-23 MED ORDER — BUSPIRONE HCL 10 MG PO TABS: 10.0000 mg | ORAL_TABLET | Freq: Two times a day (BID) | ORAL | 0 refills | Status: DC

## 2017-10-23 MED ORDER — BUSPIRONE HCL 10 MG PO TABS: 10.0000 mg | ORAL_TABLET | Freq: Two times a day (BID) | ORAL | 1 refills | Status: DC

## 2017-11-05 ENCOUNTER — Other Ambulatory Visit: Payer: Self-pay | Admitting: Internal Medicine

## 2017-11-05 DIAGNOSIS — F320 Major depressive disorder, single episode, mild: Secondary | ICD-10-CM

## 2018-01-14 ENCOUNTER — Encounter: Payer: Self-pay | Admitting: Internal Medicine

## 2018-01-14 ENCOUNTER — Ambulatory Visit: Payer: BLUE CROSS/BLUE SHIELD | Admitting: Internal Medicine

## 2018-01-14 VITALS — BP 128/82 | HR 92 | Ht 63.0 in | Wt 193.0 lb

## 2018-01-14 DIAGNOSIS — E049 Nontoxic goiter, unspecified: Secondary | ICD-10-CM | POA: Diagnosis not present

## 2018-01-14 DIAGNOSIS — F3341 Major depressive disorder, recurrent, in partial remission: Secondary | ICD-10-CM | POA: Diagnosis not present

## 2018-01-14 DIAGNOSIS — F320 Major depressive disorder, single episode, mild: Secondary | ICD-10-CM | POA: Diagnosis not present

## 2018-01-14 MED ORDER — BUPROPION HCL ER (XL) 150 MG PO TB24: 300.0000 mg | ORAL_TABLET | Freq: Every day | ORAL | 1 refills | Status: DC

## 2018-01-14 MED ORDER — BUSPIRONE HCL 10 MG PO TABS: 10.0000 mg | ORAL_TABLET | Freq: Two times a day (BID) | ORAL | 1 refills | Status: DC

## 2018-01-14 NOTE — Progress Notes (Signed)
Date:  01/14/2018   Name:  Brandi Dominguez   DOB:  January 11, 1971   MRN:  [id number]   Chief Complaint: Mass (Noticed on front of neck more towards right in Early Novemeber. No pain and not getting bigger. Wants to get checked out. )  Depression         This is a chronic problem.  The problem has been gradually improving since onset.  Associated symptoms include no fatigue and no headaches.  Treatments tried: bupropion and buspar.  Compliance with treatment is good.  Previous treatment provided significant relief.  Mass - noticed a fullness on the right side of her neck in November.  Recently dieting and losing weight and it seems more noticeable.  No tenderness but pressure causes her to cough.  No tachycardia, diarrhea, tremor or headaches.  Review of Systems  Constitutional: Negative for chills, fatigue, fever and unexpected weight change.  HENT: Negative for sore throat and trouble swallowing.   Respiratory: Negative for shortness of breath.   Cardiovascular: Negative for chest pain, palpitations and leg swelling.  Gastrointestinal: Negative for abdominal pain and diarrhea.  Neurological: Negative for dizziness and headaches.  Psychiatric/Behavioral: Positive for depression. Negative for sleep disturbance.    Patient Active Problem List   Diagnosis Date Noted  . Arthralgia of both hands 08/20/2017  . 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 Mild single current episode of major depressive disorder (HCC) 10/11/2016  . Irritable bowel syndrome 09/01/2016  . Abnormal computed tomography angiography (CTA) 09/01/2016  . Personal history of peptic ulcer disease 09/01/2016  . GERD (gastroesophageal reflux disease) 09/01/2016    Prior to Admission medications   Medication Sig Start Date End Date Taking? Authorizing Provider  buPROPion (WELLBUTRIN XL) 150 MG 24 hr tablet Take 2 tablets (300 mg total) by mouth daily. 04/04/17  Yes Reubin Milan, MD  busPIRone (BUSPAR) 10 MG tablet Take 1  tablet (10 mg total) by mouth 2 (two) times daily. 10/23/17  Yes Reubin Milan, MD  dexlansoprazole (DEXILANT) 60 MG capsule Take 1 capsule (60 mg total) by mouth daily. 08/13/17  Yes Reubin Milan, MD  diphenoxylate-atropine (LOMOTIL) 2.5-0.025 MG tablet Take 1 tablet by mouth 4 (four) times daily as needed for diarrhea or loose stools. 08/13/17  Yes Reubin Milan, MD  hyoscyamine (LEVSIN, ANASPAZ) 0.125 MG tablet Take 1 tablet (0.125 mg total) by mouth every 4 (four) hours as needed. 08/13/17  Yes Reubin Milan, MD  triamcinolone cream (KENALOG) 0.1 % Apply 1 application topically 2 (two) times daily. 08/13/17  Yes Reubin Milan, MD    Allergies  Allergen Reactions  . Diflucan [Fluconazole] Anaphylaxis  . Venlafaxine Anxiety and Palpitations    Past Surgical History:  Procedure Laterality Date  . CHOLECYSTECTOMY    . TONSILLECTOMY AND ADENOIDECTOMY      Social History   Tobacco Use  . Smoking status: Current Every Day Smoker    Packs/day: 0.50    Types: Cigarettes  . Smokeless tobacco: Never Used  Substance Use Topics  . Alcohol use: No  . Drug use: No     Medication list has been reviewed and updated.  PHQ 2/9 Scores 01/14/2018 08/13/2017  PHQ - 2 Score 0 6  PHQ- 9 Score 0 12    Physical Exam  Constitutional: She is oriented to person, place, and time. She appears well-developed. No distress.  HENT:  Head: Normocephalic and atraumatic.  Neck: Normal range of motion. Thyroid mass (right side, smooth  nontender, ~ 2 cm size) present.  Cardiovascular: Normal rate, regular rhythm and normal heart sounds.  Pulmonary/Chest: Effort normal and breath sounds normal. No respiratory distress. She has no wheezes.  Musculoskeletal: Normal range of motion. She exhibits no edema.  Neurological: She is alert and oriented to person, place, and time.  Skin: Skin is warm and dry. No rash noted.  Psychiatric: She has a normal mood and affect. Her behavior is normal.  Thought content normal.  Nursing note and vitals reviewed.   BP 128/82   Pulse 92   Ht 5\' 3"  (1.6 m)   Wt 193 lb (87.5 kg)   SpO2 97%   BMI 34.19 kg/m   Assessment and Plan: 1. Enlarged thyroid gland - Thyroid Panel With TSH - US Soft Tissue Head/Neck; Future  2. Mild single current episode of major depressive disorder (HCC) Doing well - busPIRone (BUSPAR) 10 MG tablet; Take 1 tablet (10 mg total) by mouth 2 (two) times daily.  Dispense: 180 tablet; Refill: 1  3. Recurrent major depressive disorder, in partial remission (HCC) Continue medications - buPROPion (WELLBUTRIN XL) 150 MG 24 hr tablet; Take 2 tablets (300 mg total) by mouth daily.  Dispense: 180 tablet; Refill: 1   Meds ordered this encounter  Medications  . busPIRone (BUSPAR) 10 MG tablet    Sig: Take 1 tablet (10 mg total) by mouth 2 (two) times daily.    Dispense:  180 tablet    Refill:  1  . buPROPion (WELLBUTRIN XL) 150 MG 24 hr tablet    Sig: Take 2 tablets (300 mg total) by mouth daily.    Dispense:  180 tablet    Refill:  1    Partially dictated using Animal nutritionistDragon software. Any errors are unintentional.  Bari EdwardLaura Verlaine Embry, MD [facility]  01/14/2018

## 2018-01-15 LAB — THYROID PANEL WITH TSH
Free Thyroxine Index: 2 (ref 1.2–4.9)
T3 Uptake Ratio: 27 % (ref 24–39)
T4, Total: 7.4 ug/dL (ref 4.5–12.0)
TSH: 1.52 u[IU]/mL (ref 0.450–4.500)

## 2018-01-17 ENCOUNTER — Ambulatory Visit
Admission: RE | Admit: 2018-01-17 | Discharge: 2018-01-17 | Disposition: A | Discharge status: Home / Self Care | Payer: BLUE CROSS/BLUE SHIELD | Source: Ambulatory Visit | Attending: Internal Medicine | Admitting: Internal Medicine

## 2018-01-17 DIAGNOSIS — E049 Nontoxic goiter, unspecified: Secondary | ICD-10-CM | POA: Diagnosis present

## 2018-01-17 DIAGNOSIS — E042 Nontoxic multinodular goiter: Secondary | ICD-10-CM | POA: Diagnosis not present

## 2018-01-18 ENCOUNTER — Other Ambulatory Visit: Payer: Self-pay | Admitting: Internal Medicine

## 2018-01-18 DIAGNOSIS — E042 Nontoxic multinodular goiter: Secondary | ICD-10-CM | POA: Insufficient documentation

## 2018-01-18 HISTORY — DX: Nontoxic multinodular goiter: E04.2

## 2018-01-18 NOTE — Progress Notes (Signed)
[place]. Seen note on MYCHART.

## 2018-01-24 ENCOUNTER — Other Ambulatory Visit: Payer: Self-pay | Admitting: Otolaryngology

## 2018-01-24 DIAGNOSIS — E041 Nontoxic single thyroid nodule: Secondary | ICD-10-CM

## 2018-01-30 ENCOUNTER — Ambulatory Visit: Payer: BLUE CROSS/BLUE SHIELD

## 2018-02-01 ENCOUNTER — Ambulatory Visit
Admission: RE | Admit: 2018-02-01 | Discharge: 2018-02-01 | Disposition: A | Discharge status: Home / Self Care | Payer: BLUE CROSS/BLUE SHIELD | Source: Ambulatory Visit | Attending: Otolaryngology | Admitting: Otolaryngology

## 2018-02-01 DIAGNOSIS — E042 Nontoxic multinodular goiter: Secondary | ICD-10-CM | POA: Diagnosis not present

## 2018-02-01 DIAGNOSIS — E041 Nontoxic single thyroid nodule: Secondary | ICD-10-CM

## 2018-02-01 NOTE — Discharge Instructions (Signed)
Thyroid Biopsy, Care After °Refer to this sheet in the next few weeks. These instructions provide you with information on caring for yourself after your procedure. Your health care provider may also give you more specific instructions. Your treatment has been planned according to current medical practices, but problems sometimes occur. Call your health care provider if you have any problems or questions after your procedure. °What can I expect after the procedure? °After your procedure, it is typical to have the following: °· You may have soreness and tenderness at the biopsy site for a few days. °· You may have a sore throat or a hoarse voice if you had an open biopsy. This should go away after a couple days. ° °Follow these instructions at home: °· Take medicines only as directed by your health care provider. °· To ease discomfort at the biopsy site: °? Keep your head raised on a pillow when you are lying down. °? Support the back of your head and neck with both hands as you sit up from a lying position. °· If you have a sore throat, try using throat lozenges or gargling with warm salt water. °· Keep all follow-up visits as directed by your health care provider. This is important. °Contact a health care provider if: °· You have a fever. °Get help right away if: °· You have severe bleeding from the biopsy site. °· You have difficulty swallowing. °· You have drainage, redness, swelling, or pain at the biopsy site. °· You have swollen glands (lymph nodes) in your neck. °This information is not intended to replace advice given to you by your health care provider. Make sure you discuss any questions you have with your health care provider. °Document Released: 07/08/2014 Document Revised: 08/13/2016 Document Reviewed: 03/05/2014 °Elsevier Interactive Patient Education © 2018 [employer] ° °

## 2018-02-01 NOTE — Procedures (Signed)
Successful US guided FNA X 6 of right mid thyroid nodule. No complications.  Brayton ElKevin Burnette Valenti PA-C Interventional Radiology 02/01/2018 1:37 PM

## 2018-02-04 LAB — CYTOLOGY - NON PAP

## 2018-03-18 ENCOUNTER — Encounter
Admission: RE | Admit: 2018-03-18 | Discharge: 2018-03-18 | Disposition: A | Discharge status: Home / Self Care | Payer: BLUE CROSS/BLUE SHIELD | Source: Ambulatory Visit | Attending: Otolaryngology | Admitting: Otolaryngology

## 2018-03-18 ENCOUNTER — Other Ambulatory Visit: Payer: Self-pay

## 2018-03-18 HISTORY — DX: Gastro-esophageal reflux disease without esophagitis: K21.9

## 2018-03-18 NOTE — Patient Instructions (Signed)
Your procedure is scheduled on: 03-25-18 MONDAY Report to Same Day Surgery 2nd floor medical mall [facility](Medical Mall Entrance-take elevator on left to 2nd floor.  Check in with surgery information desk.) To find out your arrival time please call [phone] between 1PM - 3PM on 03-22-18 FRIDAY  Remember: Instructions that are not followed completely may result in serious medical risk, up to and including death, or upon the discretion of your surgeon and anesthesiologist your surgery may need to be rescheduled.    _x___ 1. Do not eat food after midnight the night before your procedure. NO GUM OR CANDY AFTER MIDNIGHT.  You may drink clear liquids up to 2 hours before you are scheduled to arrive at the hospital for your procedure.  Do not drink clear liquids within 2 hours of your scheduled arrival to the hospital.  Clear liquids include  --Water or Apple juice without pulp  --Clear carbohydrate beverage such as ClearFast or Gatorade  --Black Coffee or Clear Tea (No milk, no creamers, do not add anything to the coffee or Tea    __x__ 2. No Alcohol for 24 hours before or after surgery.   __x__3. No Smoking or e-cigarettes for 24 prior to surgery.  Do not use any chewable tobacco products for at least 6 hour prior to surgery   ____  4. Bring all medications with you on the day of surgery if instructed.    __x__ 5. Notify your doctor if there is any change in your medical condition     (cold, fever, infections).    x___6. On the morning of surgery brush your teeth with toothpaste and water.  You may rinse your mouth with mouth wash if you wish.  Do not swallow any toothpaste or mouthwash.   Do not wear jewelry, make-up, hairpins, clips or nail polish.  Do not wear lotions, powders, or perfumes. You may wear deodorant.  Do not shave 48 hours prior to surgery. Men may shave face and neck.  Do not bring valuables to the hospital.    [facility] not [facility] for any belongings or  valuables.               Contacts, dentures or bridgework may not be worn into surgery.  Leave your suitcase in the car. After surgery it may be brought to your room.  For patients admitted to the hospital, discharge time is determined by your treatment team.  _  Patients discharged the day of surgery will not be allowed to drive home.  You will need someone to drive you home and stay with you the night of your procedure.    Please read over the following fact sheets that you were given:   [facility] Preparing [facility] and or MRSA Information   _x___ TAKE THE FOLLOWING MEDICATION THE MORNING OF SURGERY WITH A SMALL SIP OF WATER. These include:  1. BUSPAR   2. WELLBUTRIN  3. DEXILANT  4. TAKE AN EXTRA DEXILANT AT BEDTIME THE NIGHT BEFORE SURGERY  5.  6.  ____Fleets enema or Magnesium Citrate as directed.   _x___ Use CHG Soap or sage wipes as directed on instruction sheet   ____ Use inhalers on the day of surgery and bring to hospital day of surgery  ____ Stop Metformin and Janumet 2 days prior to surgery.    ____ Take 1/2 of usual insulin dose the night before surgery and none on the morning surgery.   ____ Follow recommendations from Cardiologist, Pulmonologist  or PCP regarding stopping Aspirin, Coumadin, Plavix ,Eliquis, Effient, or Pradaxa, and Pletal.  X____Stop Anti-inflammatories such as Advil, Aleve, Ibuprofen, Motrin, Naproxen, Naprosyn, Goodies powders or aspirin products NOW- OK to take Tylenol    ____ Stop supplements until after surgery.    ____ Bring C-Pap to the hospital.

## 2018-03-19 ENCOUNTER — Encounter
Admission: RE | Admit: 2018-03-19 | Discharge: 2018-03-19 | Disposition: A | Discharge status: Home / Self Care | Payer: BLUE CROSS/BLUE SHIELD | Source: Ambulatory Visit | Attending: Otolaryngology | Admitting: Otolaryngology

## 2018-03-19 DIAGNOSIS — Z01812 Encounter for preprocedural laboratory examination: Secondary | ICD-10-CM | POA: Insufficient documentation

## 2018-03-19 LAB — BASIC METABOLIC PANEL
Anion gap: 9 (ref 5–15)
BUN: 21 mg/dL — ABNORMAL HIGH (ref 6–20)
CO2: 27 mmol/L (ref 22–32)
Calcium: 9.3 mg/dL (ref 8.9–10.3)
Chloride: 105 mmol/L (ref 101–111)
Creatinine, Ser: 0.83 mg/dL (ref 0.44–1.00)
GFR calc Af Amer: >60 mL/min (ref >60)
GFR calc non Af Amer: >60 mL/min (ref >60)
Glucose, Bld: 101 mg/dL — ABNORMAL HIGH (ref 65–99)
Potassium: 4 mmol/L (ref 3.5–5.1)
Sodium: 141 mmol/L (ref 135–145)

## 2018-03-19 LAB — CBC
HCT: 42.7 % (ref 35.0–47.0)
Hemoglobin: 13.8 g/dL (ref 12.0–16.0)
MCH: 27.2 pg (ref 26.0–34.0)
MCHC: 32.4 g/dL (ref 32.0–36.0)
MCV: 84.1 fL (ref 80.0–100.0)
Platelets: 275 10*3/uL (ref 150–440)
RBC: 5.08 MIL/uL (ref 3.80–5.20)
RDW: 15.6 % — ABNORMAL HIGH (ref 11.5–14.5)
WBC: 9.3 10*3/uL (ref 3.6–11.0)

## 2018-03-24 MED ORDER — CEFAZOLIN SODIUM-DEXTROSE 2-4 GM/100ML-% IV SOLN
2.0000 g | Freq: Once | INTRAVENOUS | Status: AC
  Administered 2018-03-25: 2 g via INTRAVENOUS

## 2018-03-25 ENCOUNTER — Other Ambulatory Visit: Payer: Self-pay

## 2018-03-25 ENCOUNTER — Ambulatory Visit: Payer: BLUE CROSS/BLUE SHIELD | Admitting: Anesthesiology

## 2018-03-25 ENCOUNTER — Observation Stay
Admission: RE | Admit: 2018-03-25 | Discharge: 2018-03-26 | Disposition: A | Discharge status: Home / Self Care | Payer: BLUE CROSS/BLUE SHIELD | Source: Ambulatory Visit | Attending: Otolaryngology | Admitting: Otolaryngology

## 2018-03-25 ENCOUNTER — Encounter: Payer: Self-pay | Admitting: *Deleted

## 2018-03-25 ENCOUNTER — Encounter
Admission: RE | Disposition: A | Discharge status: Home / Self Care | Payer: Self-pay | Source: Ambulatory Visit | Attending: Otolaryngology

## 2018-03-25 DIAGNOSIS — F17210 Nicotine dependence, cigarettes, uncomplicated: Secondary | ICD-10-CM | POA: Diagnosis not present

## 2018-03-25 DIAGNOSIS — E063 Autoimmune thyroiditis: Secondary | ICD-10-CM | POA: Insufficient documentation

## 2018-03-25 DIAGNOSIS — F329 Major depressive disorder, single episode, unspecified: Secondary | ICD-10-CM | POA: Diagnosis not present

## 2018-03-25 DIAGNOSIS — K219 Gastro-esophageal reflux disease without esophagitis: Secondary | ICD-10-CM | POA: Diagnosis not present

## 2018-03-25 DIAGNOSIS — Z79899 Other long term (current) drug therapy: Secondary | ICD-10-CM | POA: Insufficient documentation

## 2018-03-25 DIAGNOSIS — E890 Postprocedural hypothyroidism: Secondary | ICD-10-CM

## 2018-03-25 DIAGNOSIS — E042 Nontoxic multinodular goiter: Principal | ICD-10-CM | POA: Insufficient documentation

## 2018-03-25 DIAGNOSIS — E041 Nontoxic single thyroid nodule: Secondary | ICD-10-CM

## 2018-03-25 DIAGNOSIS — Z9089 Acquired absence of other organs: Secondary | ICD-10-CM

## 2018-03-25 DIAGNOSIS — J039 Acute tonsillitis, unspecified: Secondary | ICD-10-CM | POA: Diagnosis not present

## 2018-03-25 HISTORY — DX: Nontoxic single thyroid nodule: E04.1

## 2018-03-25 HISTORY — DX: Other complications of anesthesia, initial encounter: T88.59XA

## 2018-03-25 HISTORY — PX: THYROIDECTOMY: SHX17

## 2018-03-25 HISTORY — DX: Adverse effect of unspecified anesthetic, initial encounter: T41.45XA

## 2018-03-25 LAB — POCT PREGNANCY, URINE: Preg Test, Ur: NEGATIVE

## 2018-03-25 SURGERY — THYROIDECTOMY
Anesthesia: General

## 2018-03-25 MED ORDER — LIDOCAINE-EPINEPHRINE (PF) 1 %-1:200000 IJ SOLN
INTRAMUSCULAR | Status: AC
  Filled 2018-03-25: qty 30

## 2018-03-25 MED ORDER — CALCIUM CARBONATE ANTACID 500 MG PO CHEW
3.0000 | CHEWABLE_TABLET | Freq: Every day | ORAL | Status: DC
  Administered 2018-03-25: 600 mg via ORAL
  Filled 2018-03-25: qty 3

## 2018-03-25 MED ORDER — FAMOTIDINE 20 MG PO TABS
ORAL_TABLET | ORAL | Status: AC
  Filled 2018-03-25: qty 1

## 2018-03-25 MED ORDER — FENTANYL CITRATE (PF) 100 MCG/2ML IJ SOLN
INTRAMUSCULAR | Status: DC | PRN
  Administered 2018-03-25: 50 ug via INTRAVENOUS
  Administered 2018-03-25: 25 ug via INTRAVENOUS
  Administered 2018-03-25: 50 ug via INTRAVENOUS
  Administered 2018-03-25: 25 ug via INTRAVENOUS
  Administered 2018-03-25: 50 ug via INTRAVENOUS

## 2018-03-25 MED ORDER — FAMOTIDINE 20 MG PO TABS
20.0000 mg | ORAL_TABLET | Freq: Once | ORAL | Status: AC
  Administered 2018-03-25: 20 mg via ORAL

## 2018-03-25 MED ORDER — PHENYLEPHRINE HCL 10 MG/ML IJ SOLN
INTRAMUSCULAR | Status: DC | PRN
  Administered 2018-03-25: 200 ug via INTRAVENOUS

## 2018-03-25 MED ORDER — ROCURONIUM BROMIDE 100 MG/10ML IV SOLN
INTRAVENOUS | Status: DC | PRN
  Administered 2018-03-25: 10 mg via INTRAVENOUS

## 2018-03-25 MED ORDER — DIPHENOXYLATE-ATROPINE 2.5-0.025 MG PO TABS: 1.0000 | ORAL_TABLET | Freq: Four times a day (QID) | ORAL | Status: DC | PRN

## 2018-03-25 MED ORDER — ONDANSETRON HCL 4 MG/2ML IJ SOLN
4.0000 mg | Freq: Once | INTRAMUSCULAR | Status: AC
  Administered 2018-03-25: 4 mg via INTRAVENOUS

## 2018-03-25 MED ORDER — BUSPIRONE HCL 10 MG PO TABS
10.0000 mg | ORAL_TABLET | Freq: Two times a day (BID) | ORAL | Status: DC
  Administered 2018-03-25: 10 mg via ORAL
  Filled 2018-03-25 (×3): qty 1

## 2018-03-25 MED ORDER — ONDANSETRON 4 MG PO TBDP: 4.0000 mg | ORAL_TABLET | Freq: Four times a day (QID) | ORAL | Status: DC | PRN

## 2018-03-25 MED ORDER — MIDAZOLAM HCL 2 MG/2ML IJ SOLN
INTRAMUSCULAR | Status: DC | PRN
  Administered 2018-03-25: 2 mg via INTRAVENOUS

## 2018-03-25 MED ORDER — PROPOFOL 10 MG/ML IV BOLUS
INTRAVENOUS | Status: DC | PRN
  Administered 2018-03-25: 150 mg via INTRAVENOUS

## 2018-03-25 MED ORDER — MAGNESIUM HYDROXIDE 400 MG/5ML PO SUSP
30.0000 mL | Freq: Every day | ORAL | Status: DC | PRN
  Filled 2018-03-25: qty 30

## 2018-03-25 MED ORDER — HYDROCODONE-ACETAMINOPHEN 5-325 MG PO TABS
1.0000 | ORAL_TABLET | ORAL | Status: DC | PRN
  Administered 2018-03-25: 2 via ORAL
  Administered 2018-03-25 – 2018-03-26 (×2): 1 via ORAL
  Administered 2018-03-26: 2 via ORAL
  Filled 2018-03-25 (×2): qty 1
  Filled 2018-03-25 (×2): qty 2

## 2018-03-25 MED ORDER — FENTANYL CITRATE (PF) 100 MCG/2ML IJ SOLN
INTRAMUSCULAR | Status: AC
  Filled 2018-03-25: qty 2

## 2018-03-25 MED ORDER — ACETAMINOPHEN 325 MG PO TABS
650.0000 mg | ORAL_TABLET | Freq: Four times a day (QID) | ORAL | Status: DC | PRN
  Administered 2018-03-25: 650 mg via ORAL
  Filled 2018-03-25: qty 2

## 2018-03-25 MED ORDER — FENTANYL CITRATE (PF) 100 MCG/2ML IJ SOLN
25.0000 ug | INTRAMUSCULAR | Status: DC | PRN
Start: 2018-03-25 — End: 2018-03-25
  Administered 2018-03-25 (×4): 25 ug via INTRAVENOUS

## 2018-03-25 MED ORDER — MORPHINE SULFATE (PF) 4 MG/ML IV SOLN
3.0000 mg | INTRAVENOUS | Status: DC | PRN
  Administered 2018-03-25 (×3): 3 mg via INTRAVENOUS
  Filled 2018-03-25 (×3): qty 1

## 2018-03-25 MED ORDER — FENTANYL CITRATE (PF) 100 MCG/2ML IJ SOLN
INTRAMUSCULAR | Status: AC
  Administered 2018-03-25: 25 ug via INTRAVENOUS
  Filled 2018-03-25: qty 2

## 2018-03-25 MED ORDER — CEFAZOLIN SODIUM-DEXTROSE 2-4 GM/100ML-% IV SOLN
INTRAVENOUS | Status: AC
  Filled 2018-03-25: qty 100

## 2018-03-25 MED ORDER — DEXTROSE-NACL 5-0.45 % IV SOLN
INTRAVENOUS | Status: DC
  Administered 2018-03-25 (×2): via INTRAVENOUS

## 2018-03-25 MED ORDER — MIDAZOLAM HCL 2 MG/2ML IJ SOLN
INTRAMUSCULAR | Status: AC
  Filled 2018-03-25: qty 2

## 2018-03-25 MED ORDER — SEVOFLURANE IN SOLN
RESPIRATORY_TRACT | Status: AC
Start: 2018-03-25 — End: 2018-03-25
  Filled 2018-03-25: qty 250

## 2018-03-25 MED ORDER — LIDOCAINE-EPINEPHRINE (PF) 1 %-1:200000 IJ SOLN
INTRAMUSCULAR | Status: DC | PRN
  Administered 2018-03-25: 7 mL

## 2018-03-25 MED ORDER — DEXAMETHASONE SODIUM PHOSPHATE 4 MG/ML IJ SOLN
6.0000 mg | Freq: Once | INTRAMUSCULAR | Status: AC
  Administered 2018-03-25: 6 mg via INTRAVENOUS
  Filled 2018-03-25: qty 1.5

## 2018-03-25 MED ORDER — LACTATED RINGERS IV SOLN
INTRAVENOUS | Status: DC
  Administered 2018-03-25: 50 mL/h via INTRAVENOUS

## 2018-03-25 MED ORDER — PROPOFOL 10 MG/ML IV BOLUS
INTRAVENOUS | Status: AC
  Filled 2018-03-25: qty 20

## 2018-03-25 MED ORDER — BACITRACIN ZINC 500 UNIT/GM EX OINT
TOPICAL_OINTMENT | CUTANEOUS | Status: AC
Start: 2018-03-25 — End: 2018-03-25
  Filled 2018-03-25: qty 28.35

## 2018-03-25 MED ORDER — ONDANSETRON HCL 4 MG/2ML IJ SOLN: 4.0000 mg | Freq: Four times a day (QID) | INTRAMUSCULAR | Status: DC | PRN

## 2018-03-25 MED ORDER — CALCIUM CARBONATE 1500 (600 CA) MG PO TABS
600.0000 mg | ORAL_TABLET | Freq: Every day | ORAL | Status: DC
  Filled 2018-03-25: qty 1

## 2018-03-25 MED ORDER — ONDANSETRON HCL 4 MG/2ML IJ SOLN
INTRAMUSCULAR | Status: AC
  Filled 2018-03-25: qty 2

## 2018-03-25 MED ORDER — SUCCINYLCHOLINE CHLORIDE 20 MG/ML IJ SOLN
INTRAMUSCULAR | Status: DC | PRN
  Administered 2018-03-25: 100 mg via INTRAVENOUS

## 2018-03-25 MED ORDER — FLEET ENEMA 7-19 GM/118ML RE ENEM: 1.0000 | ENEMA | Freq: Once | RECTAL | Status: DC | PRN

## 2018-03-25 MED ORDER — BISACODYL 5 MG PO TBEC: 5.0000 mg | DELAYED_RELEASE_TABLET | Freq: Every day | ORAL | Status: DC | PRN

## 2018-03-25 MED ORDER — LIDOCAINE HCL (CARDIAC) 20 MG/ML IV SOLN
INTRAVENOUS | Status: DC | PRN
  Administered 2018-03-25: 80 mg via INTRAVENOUS

## 2018-03-25 MED ORDER — ONDANSETRON HCL 4 MG/2ML IJ SOLN
4.0000 mg | Freq: Once | INTRAMUSCULAR | Status: AC | PRN
  Administered 2018-03-25: 4 mg via INTRAVENOUS

## 2018-03-25 SURGICAL SUPPLY — 37 items
BLADE SURG 15 STRL LF DISP TIS (BLADE) ×2 IMPLANT
BLADE SURG 15 STRL SS (BLADE) ×2
CANISTER SUCT 1200ML W/VALVE (MISCELLANEOUS) ×2 IMPLANT
CORD BIP STRL DISP 12FT (MISCELLANEOUS) ×2 IMPLANT
DRAIN TLS ROUND 10FR (DRAIN) ×4 IMPLANT
DRAPE MAG INST 16X20 L/F (DRAPES) ×2 IMPLANT
DRSG TEGADERM 2-3/8X2-3/4 SM (GAUZE/BANDAGES/DRESSINGS) ×2 IMPLANT
DRSG TEGADERM 4X4.75 (GAUZE/BANDAGES/DRESSINGS) ×2 IMPLANT
DRSG TELFA 3X8 NADH (GAUZE/BANDAGES/DRESSINGS) ×2 IMPLANT
ELECT LARYNGEAL 6/7 (MISCELLANEOUS) ×2
ELECT LARYNGEAL 8/9 (MISCELLANEOUS)
ELECT REM PT RETURN 9FT ADLT (ELECTROSURGICAL) ×2
ELECTRODE LARYNGEAL 6/7 (MISCELLANEOUS) ×1 IMPLANT
ELECTRODE LARYNGEAL 8/9 (MISCELLANEOUS) IMPLANT
ELECTRODE REM PT RTRN 9FT ADLT (ELECTROSURGICAL) ×1 IMPLANT
FORCEPS JEWEL BIP 4-3/4 STR (INSTRUMENTS) ×2 IMPLANT
GLOVE BIO SURGEON STRL SZ7.5 (GLOVE) ×2 IMPLANT
GLOVE PROTEXIS LATEX SZ 7.5 (GLOVE) ×2 IMPLANT
GOWN STRL REUS W/ TWL LRG LVL3 (GOWN DISPOSABLE) ×3 IMPLANT
GOWN STRL REUS W/TWL LRG LVL3 (GOWN DISPOSABLE) ×3
HEMOSTAT SURGICEL 2X3 (HEMOSTASIS) ×2 IMPLANT
HOOK STAY BLUNT/RETRACTOR 5M (MISCELLANEOUS) ×2 IMPLANT
LABEL OR SOLS (LABEL) ×2 IMPLANT
NS IRRIG 500ML POUR BTL (IV SOLUTION) ×2 IMPLANT
PACK HEAD/NECK (MISCELLANEOUS) ×2 IMPLANT
PROBE NEUROSIGN BIPOL (MISCELLANEOUS) ×1 IMPLANT
PROBE NEUROSIGN BIPOLAR (MISCELLANEOUS) ×1
SHEARS HARMONIC 9CM CVD (BLADE) ×2 IMPLANT
SPONGE KITTNER 5P (MISCELLANEOUS) ×2 IMPLANT
SPONGE XRAY 4X4 16PLY STRL (MISCELLANEOUS) ×2 IMPLANT
STRAP SAFETY 5IN WIDE (MISCELLANEOUS) IMPLANT
SUT ETHILON 6 0 9-3 1X18 BLK (SUTURE) ×2 IMPLANT
SUT PROLENE 6 0 P 1 18 (SUTURE) ×2 IMPLANT
SUT SILK 2 0 (SUTURE) ×1
SUT SILK 2-0 18XBRD TIE 12 (SUTURE) ×1 IMPLANT
SUT VIC AB 4-0 RB1 18 (SUTURE) ×2 IMPLANT
SYSTEM CHEST DRAIN TLS 7FR (DRAIN) IMPLANT

## 2018-03-25 NOTE — Anesthesia Preprocedure Evaluation (Signed)
Anesthesia Evaluation  Patient identified by MRN, date of birth, ID band Patient awake    Reviewed: Allergy & Precautions, NPO status , Patient's Chart, lab work & pertinent test results  History of Anesthesia Complications (+) history of anesthetic complications (difficulty with urination post-op)  Airway Mallampati: II       Dental   Pulmonary neg sleep apnea, neg COPD, Current Smoker,           Cardiovascular (-) hypertension(-) Past MI and (-) CHF (-) dysrhythmias (-) Valvular Problems/Murmurs     Neuro/Psych neg Seizures Anxiety Depression    GI/Hepatic Neg liver ROS, GERD  Medicated and Controlled,  Endo/Other  neg diabetes  Renal/GU negative Renal ROS     Musculoskeletal   Abdominal   Peds  Hematology   Anesthesia Other Findings   Reproductive/Obstetrics                             Anesthesia Physical Anesthesia Plan  ASA: II  Anesthesia Plan: General   Post-op Pain Management:    Induction: Intravenous  PONV Risk Score and Plan: 2  Airway Management Planned: Oral ETT  Additional Equipment:   Intra-op Plan:   Post-operative Plan:   Informed Consent: I have reviewed the patients History and Physical, chart, labs and discussed the procedure including the risks, benefits and alternatives for the proposed anesthesia with the patient or authorized representative who has indicated his/her understanding and acceptance.     Plan Discussed with:   Anesthesia Plan Comments:         Anesthesia Quick Evaluation

## 2018-03-25 NOTE — Progress Notes (Signed)
Additional zofran given per okayed by dr Henrene Hawkingkephart   Pt nauseated

## 2018-03-25 NOTE — Op Note (Signed)
03/25/2018  10:15 AM    Coger, Kelechi  [id number]   Pre-Op Dx: Very large right thyroid nodule with multiple other nodules on both sides  Post-op Dx: Same  Proc: Total thyroidectomy  Surg:  Cammy CopaPaul H Neta Upadhyay   assist: Roney Mansreighton Vaught  Anes:  GOT  EBL: 75 mL  Comp: None  Findings: The patient had a very large right thyroid nodule that was attached laterally and inferiorly to the right thyroid gland.  Both glands were somewhat enlarged.  There are multiple other small nodules as well.  The recurrent laryngeal nerves were found in both sides although the nerve stimulator was not working  properly so I was never able to get the right side to stimulate well.  We could feel muscle contractions with stimulation even though the monitor was not working.  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    Procedure: The patient was brought to the operating room and was placed in the supine position.  She was given general anesthesia by oral endotracheal intubation.  The endotracheal tube had previously been wrapped with electrodes for continuous monitoring of the recurrent laryngeal nerve.  A shoulder roll was placed for extension of the neck.  The neck was marked at a low skin crease.  The right nodule was very palpable.  The skin was then infiltrated with 7 mL of 1% Xylocaine with epi 1: 100,000 for vasoconstriction.  The skin was then prepped and draped in a sterile fashion.  Skin incision was created through skin and subcu and the platysma layer.  The strap muscles were then divided in the midline and the thyroid gland was visible.  The strap muscles were elevated over the left side first for visualizing the gland.  There is very nodular gland that was freed up laterally and then superiorly.  At the superior pole of the vessels were freed up in a superior parathyroid gland was found in the left in the wound.  The gland was then  rotated a little more medially and some of the inferior attachments were then freed up.  The recurrent laryngeal nerve was found lying deep and was tracked up to the inferior portion of the larynx.  The vessels overlying it were freed up and then Berry's ligament was freed up to free up the entire left thyroid gland.  This was freed from the trachea to the midline.  This nerve stimulated well and there is no bleeding in the bed.  The strap muscles were then elevated over the right gland and there is a very large nodule inferiorly that was attached to the gland.  The superior pole was freed up first and again a parathyroid gland was evident attached to the superior vessels.  These were cut across and the superior pole the gland was freed up and was allowed to rotate outward some.  The inferior thyroid vessels were then freed up coming over the inferior trachea.  This help to loosen up the attachments inferiorly and eventually I could free up the entire large nodule off the inferior pole and rotate that all the wounds and the whole gland could rotate medially.  At this point attention was drawn to the recurrent laryngeal nerve and the stimulator did not seem to stimulate well.  We could then hear a leak in apparently the tube had dislodged some of the cuff was on the outside of the cords.  The cuff was deflated  and the tube was advanced back through the larynx under direct vision using the glide scope.  Cuff was reinflated and now the nerve stimulator seem to be working a little bit more.  We reevaluated the right side again but the cuff pulled back out again.  It was replaced again under direct vision but this time the tube was placed further down so that the electrodes were all the way through the cords and were not working any further.  This secured the airway better.  We could see the recurrent laryngeal nerve as it came up to the larynx.  This is all intact.  There is some significantly enlarged veins around it  that had to be cut across.  We could stimulate the nerve and feel movement of the laryngeal muscles but the monitor was no longer working.  The remaining attachments at [facility] ligament were then freed up from the gland and the entire gland was removed.  The gland was then marked using a small suture at the left superior tip and a long suture at the right superior tip.  The neck was copiously irrigated on both sides.  There was small bleeding site on the right side where the vein was cauterized with bipolar cautery most of the procedure had been done using the harmonic scalpel for controlling bleeding while the tissue was cut.  There is no further bleeding noted on either side.  Surgicel was placed into the gutter on both sides and then a 10 TLS drain was placed into each side and crisscross in the midline and brought out through separate stab incisions.  The strap muscles were then closed loosely and the platysma layer was then closed as well using 4-0 Vicryls.  The dermis was closed with 4-0 Vicryls.  The skin edges were held in apposition with a running locking 6-0 Prolene.  The wound was covered with a small amount of bacitracin followed by Telfa and a Tegaderm dressing.  The drains were placed to low continuous Vacutainer suction.  The patient was awakened and taken to recovery room in satisfactory condition.  There were no operative complications.  Dispo:   To PACU to be transferred to the floor and watched overnight.  Plan: We will check this evening to make sure she is doing well and then plan to pull the drains in the morning and discharge home in the morning.  Beverly Sessions Damante Spragg  03/25/2018 10:15 AM

## 2018-03-25 NOTE — H&P (Signed)
H&P has been reviewedand patient reevaluated,  and no changes necessary. To be downloaded later.  

## 2018-03-25 NOTE — Anesthesia Procedure Notes (Signed)
Procedure Name: Intubation Date/Time: 03/25/2018 7:37 AM Performed by: Danelle BerryWarr, Quentez Lober E, CRNA Pre-anesthesia Checklist: Patient identified, Emergency Drugs available, Suction available, Patient being monitored and Timeout performed Patient Re-evaluated:Patient Re-evaluated prior to induction Oxygen Delivery Method: Circle system utilized and Simple face mask Preoxygenation: Pre-oxygenation with 100% oxygen Induction Type: IV induction Ventilation: Mask ventilation without difficulty Laryngoscope Size: McGraph and 3 Grade View: Grade I Tube type: Oral Tube size: 7.0 mm Number of attempts: 1 Airway Equipment and Method: Stylet Placement Confirmation: ETT inserted through vocal cords under direct vision,  positive ETCO2 and breath sounds checked- equal and bilateral Secured at: 20 cm Tube secured with: Tape Dental Injury: Teeth and Oropharynx as per pre-operative assessment

## 2018-03-25 NOTE — Anesthesia Post-op Follow-up Note (Signed)
Anesthesia QCDR form completed.        

## 2018-03-25 NOTE — Anesthesia Postprocedure Evaluation (Signed)
Anesthesia Post Note  Patient: Brandi Dominguez  Procedure(s) Performed: THYROIDECTOMY (N/A )  Patient location during evaluation: PACU Anesthesia Type: General Level of consciousness: awake and alert Pain management: pain level controlled Vital Signs Assessment: post-procedure vital signs reviewed and stable Respiratory status: spontaneous breathing and respiratory function stable Cardiovascular status: stable Anesthetic complications: no     Last Vitals:  Vitals:   03/25/18 1049 03/25/18 1104  BP:  112/75  Pulse: 92 88  Resp: 17 16  Temp:    SpO2: 96% 99%    Last Pain:  Vitals:   03/25/18 1104  TempSrc:   PainSc: 6                  Alanzo Lamb K

## 2018-03-25 NOTE — Progress Notes (Addendum)
03/25/2018 6:25 PM  S: The patient had some nausea earlier but that seemed to settle down.  She is still having discomfort when she swallows.  Also complaining of a feeling of some fluid in her ears.  O: Vital signs are stable.  Her wound is flat with no bruising.  The drains are intact and appear to be draining appropriately.  No unusual drainage.  She is taking in liquids well.  She is slightly hoarse.  A: She seems to be pretty stable.  No signs of any wound issues currently.  P.  Will give her 6 mg of Decadron since she never got any intraoperatively to help settle down any swelling that may be happening in her eustachian tubes or back of her throat and vocal cord area.  Will check in the morning and plan to remove her drains and redressed the wound.  Currently planning to discharge her home in the morning.  We will check her calcium level as well on a morning draw.    P Brandi Dominguez.

## 2018-03-25 NOTE — Transfer of Care (Signed)
Immediate Anesthesia Transfer of Care Note  Patient: Brandi Dominguez  Procedure(s) Performed: THYROIDECTOMY (N/A )  Patient Location: PACU  Anesthesia Type:General  Level of Consciousness: awake  Airway & Oxygen Therapy: Patient Spontanous Breathing and Patient connected to face mask oxygen  Post-op Assessment: Report given to RN and Post -op Vital signs reviewed and stable  Post vital signs: Reviewed and stable  Last Vitals:  Vitals Value Taken Time  BP    Temp    Pulse    Resp    SpO2      Last Pain:  Vitals:   03/25/18 0621  TempSrc: Oral         Complications: No apparent anesthesia complications

## 2018-03-26 DIAGNOSIS — E042 Nontoxic multinodular goiter: Secondary | ICD-10-CM | POA: Diagnosis not present

## 2018-03-26 LAB — CALCIUM: Calcium: 8.1 mg/dL — ABNORMAL LOW (ref 8.9–10.3)

## 2018-03-26 NOTE — Discharge Summary (Signed)
Physician Discharge Summary  Patient ID: Brandi Dominguez MRN: [id number] DOB/AGE: 45/12/1970 45 y.o.  Admit date: 03/25/2018 Discharge date: 03/26/2018  Admission Diagnoses: Multiple large thyroid nodules   Discharge Diagnoses: Same Active Problems:   Status post total thyroidectomy   Discharged Condition: good  Hospital Course: The patient was admitted yesterday and had total thyroidectomy with sparing of the recurrent laryngeal nerves and parathyroid glands.  She is swallowing well with some discomfort, but Norco seems to control her pain well.  She has no shortness of breath.  Her lab work was good this morning.  She will follow-up in the office in 6 days for suture removal.  Consults: None  Significant Diagnostic Studies: labs: Calcium 8.1 this morning  Treatments: surgery: Total thyroidectomy  Discharge Exam: Blood pressure 121/72, pulse 77, temperature 98.2 F (36.8 C), temperature source Oral, resp. rate 18, height 5\' 3"  (1.6 m), weight 83.9 kg (185 lb), last menstrual period 03/15/2018, SpO2 97 %. The wound is flat and looks good.  The drains been working well and are removed this morning.  The wound is redressed.  Her voice is clear and she is breathing easily.  Disposition: Discharge disposition: 01-Home or Self Care       Discharge Instructions    Call MD for:  difficulty breathing, headache or visual disturbances   Complete by:  As directed    Call MD for:  persistant nausea and vomiting   Complete by:  As directed    Call MD for:  redness, tenderness, or signs of infection (pain, swelling, redness, odor or green/yellow discharge around incision site)   Complete by:  As directed    Call MD for:  temperature >100.4   Complete by:  As directed    Increase activity slowly   Complete by:  As directed      Allergies as of 03/26/2018      Reactions   Diflucan [fluconazole] Anaphylaxis   Venlafaxine Anxiety, Palpitations      Medication List    TAKE these  medications   acetaminophen 325 MG tablet Commonly known as:  TYLENOL Take 650 mg by mouth every 6 (six) hours as needed (for pain/headaches.).   buPROPion 150 MG 24 hr tablet Commonly known as:  WELLBUTRIN XL Take 2 tablets (300 mg total) by mouth daily. What changed:  when to take this   busPIRone 10 MG tablet Commonly known as:  BUSPAR Take 1 tablet (10 mg total) by mouth 2 (two) times daily.   CALCIUM 600 1500 (600 Ca) MG Tabs tablet Generic drug:  calcium carbonate Take 600 mg of elemental calcium by mouth daily.   dexlansoprazole 60 MG capsule Commonly known as:  DEXILANT Take 1 capsule (60 mg total) by mouth daily. What changed:    when to take this  reasons to take this   diphenoxylate-atropine 2.5-0.025 MG tablet Commonly known as:  LOMOTIL Take 1 tablet by mouth 4 (four) times daily as needed for diarrhea or loose stools.   hyoscyamine 0.125 MG tablet Commonly known as:  LEVSIN, ANASPAZ Take 1 tablet (0.125 mg total) by mouth every 4 (four) hours as needed. What changed:  reasons to take this   triamcinolone cream 0.1 % Commonly known as:  KENALOG Apply 1 application topically 2 (two) times daily. What changed:    when to take this  reasons to take this        Signed: Cammy Copaaul H Timothee Gali 03/26/2018, 7:54 AM

## 2018-03-26 NOTE — Progress Notes (Signed)
Brandi Dominguez to be D/C'd home per MD order.  Discussed prescriptions and follow up appointments with the patient. Prescriptions given to patient, medication list explained in detail. Pt verbalized understanding.  Allergies as of 03/26/2018      Reactions   Diflucan [fluconazole] Anaphylaxis   Venlafaxine Anxiety, Palpitations      Medication List    TAKE these medications   acetaminophen 325 MG tablet Commonly known as:  TYLENOL Take 650 mg by mouth every 6 (six) hours as needed (for pain/headaches.).   buPROPion 150 MG 24 hr tablet Commonly known as:  WELLBUTRIN XL Take 2 tablets (300 mg total) by mouth daily. What changed:  when to take this   busPIRone 10 MG tablet Commonly known as:  BUSPAR Take 1 tablet (10 mg total) by mouth 2 (two) times daily.   CALCIUM 600 1500 (600 Ca) MG Tabs tablet Generic drug:  calcium carbonate Take 600 mg of elemental calcium by mouth daily.   dexlansoprazole 60 MG capsule Commonly known as:  DEXILANT Take 1 capsule (60 mg total) by mouth daily. What changed:    when to take this  reasons to take this   diphenoxylate-atropine 2.5-0.025 MG tablet Commonly known as:  LOMOTIL Take 1 tablet by mouth 4 (four) times daily as needed for diarrhea or loose stools.   hyoscyamine 0.125 MG tablet Commonly known as:  LEVSIN, ANASPAZ Take 1 tablet (0.125 mg total) by mouth every 4 (four) hours as needed. What changed:  reasons to take this   triamcinolone cream 0.1 % Commonly known as:  KENALOG Apply 1 application topically 2 (two) times daily. What changed:    when to take this  reasons to take this       Vitals:   03/26/18 0017 03/26/18 0517  BP: 120/76 121/72  Pulse: 88 77  Resp: 18 18  Temp: 98.1 F (36.7 C) 98.2 F (36.8 C)  SpO2: 95% 97%    Skin clean, dry and intact without evidence of skin break down, no evidence of skin tears noted. IV catheter discontinued intact. Site without signs and symptoms of complications. Dressing  and pressure applied. Pt denies pain at this time. No complaints noted.  An After Visit Summary was printed and given to the patient. Patient escorted via WC, and D/C home via private auto.  Brandi Dominguez

## 2018-03-28 LAB — SURGICAL PATHOLOGY

## 2018-05-13 IMAGING — CT CT ANGIO HEAD
3 of 9 series · 16 of 47 positions shown · IV contrast (iopamidol)
Comparison: Prior study from 06/08/2012.

CLINICAL DATA: Initial evaluation for acute headache.

EXAM:
CT ANGIOGRAPHY HEAD
TECHNIQUE: Multidetector CT imaging of the head was performed using the
standard protocol during bolus administration of intravenous
contrast. Multiplanar CT image reconstructions and MIPs were
obtained to evaluate the vascular anatomy.
CONTRAST:  1 IZU1V4-AEE IOPAMIDOL (IZU1V4-AEE) INJECTION 61%

[Series 6: cta head · axial · 0.43mm/px · z∈[-193,-70]mm · 10 of 151 slices shown]
[im 14/151  brain]
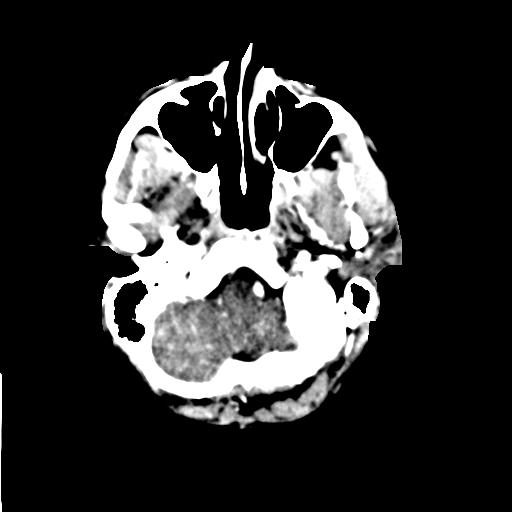
[im 28/151  bone]
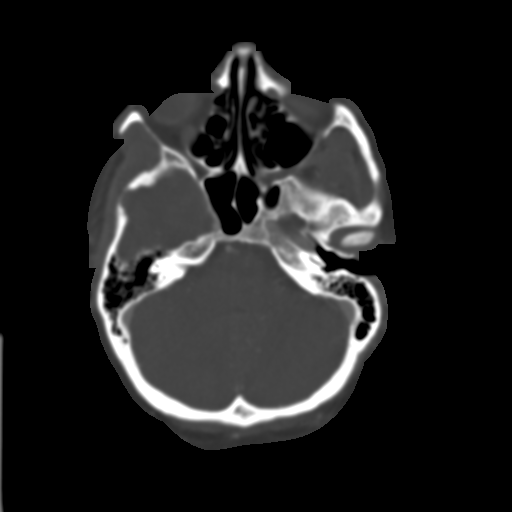
[im 41/151  brain]
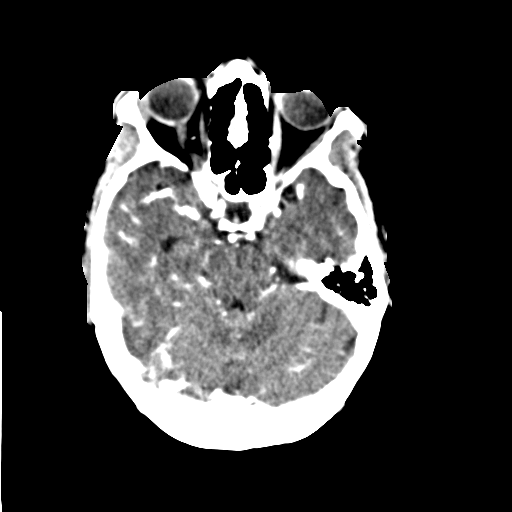
[im 55/151  bone]
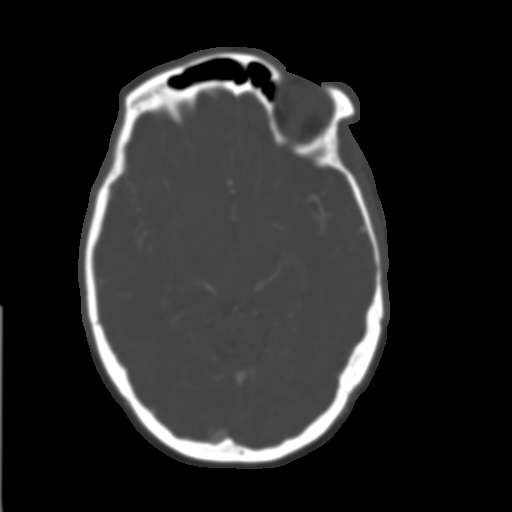
[im 69/151  brain]
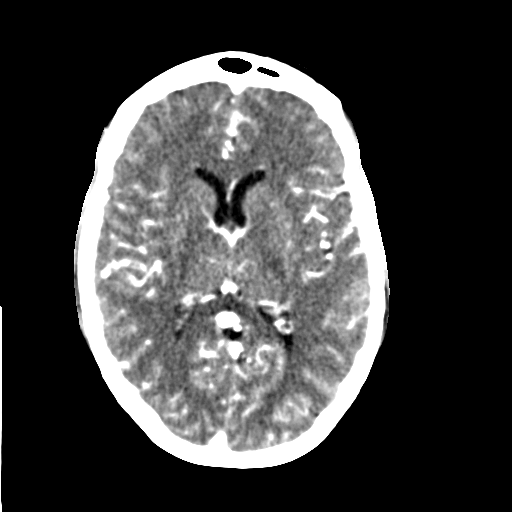
[im 82/151  bone]
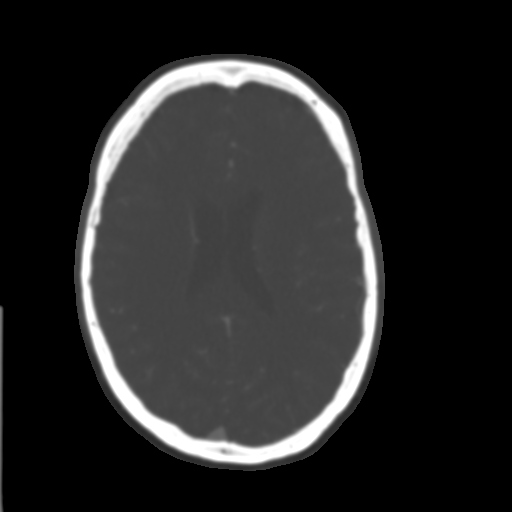
[im 96/151  brain]
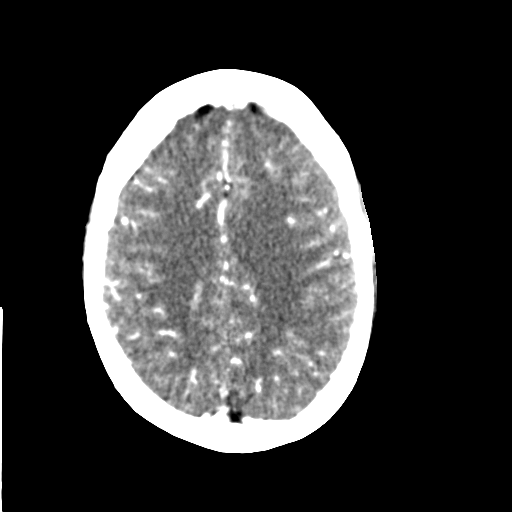
[im 110/151  bone]
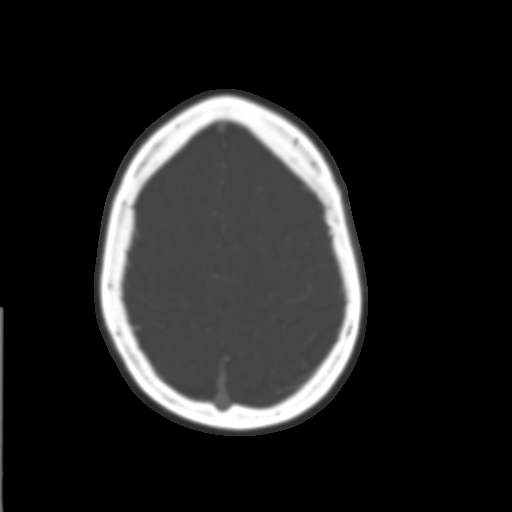
[im 123/151  brain]
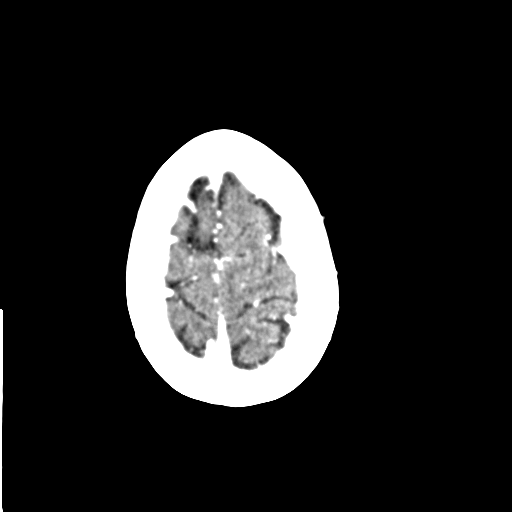
[im 137/151  bone]
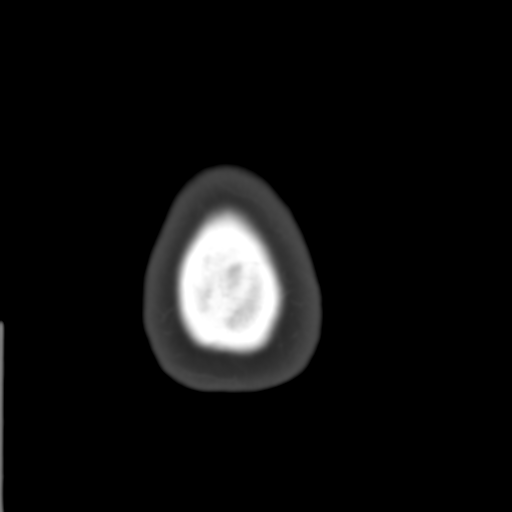

[Series 9: cor thin · coronal · 0.31mm/px · 3 of 182 slices shown]
[im 52/182  brain]
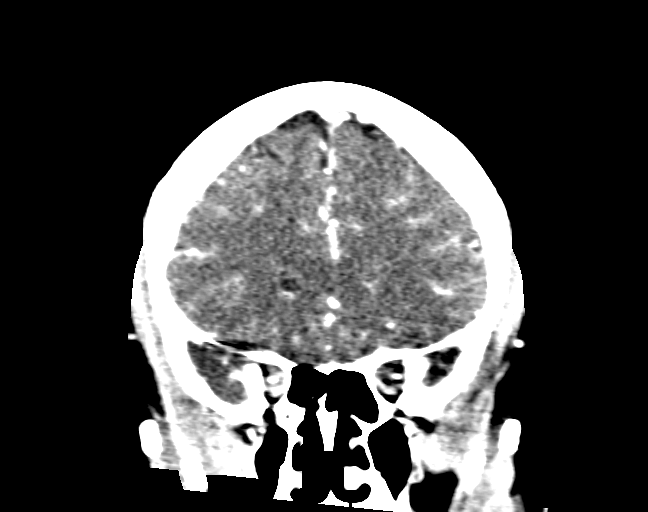
[im 78/182  brain]
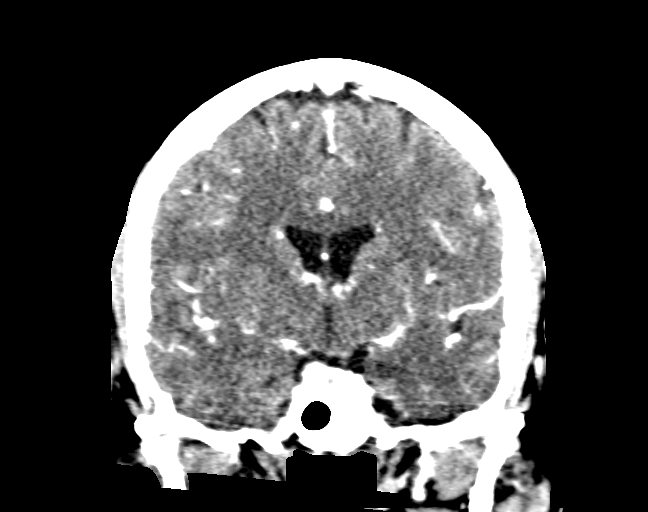
[im 104/182  brain]
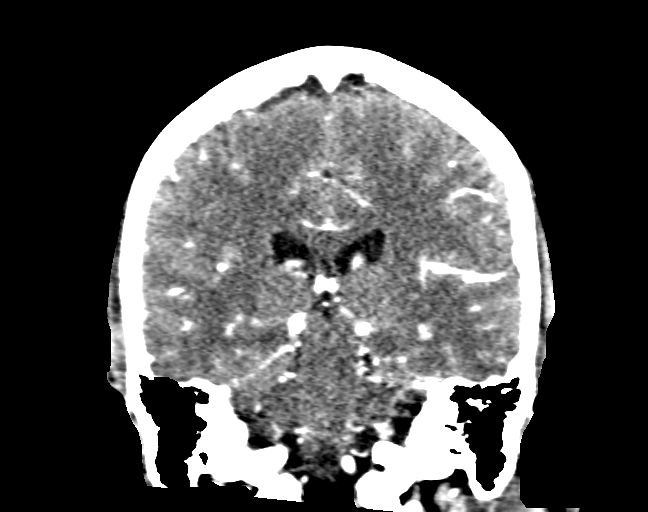

[Series 11: sag thin · sagittal · 0.31mm/px · 3 of 146 slices shown]
[im 30/146  brain]
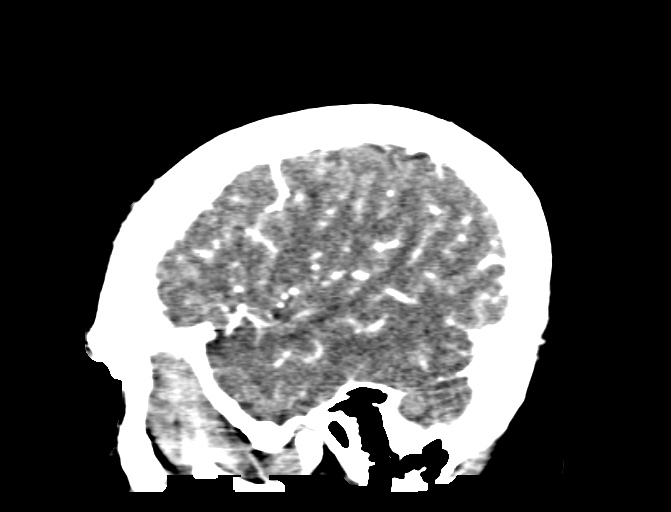
[im 59/146  brain]
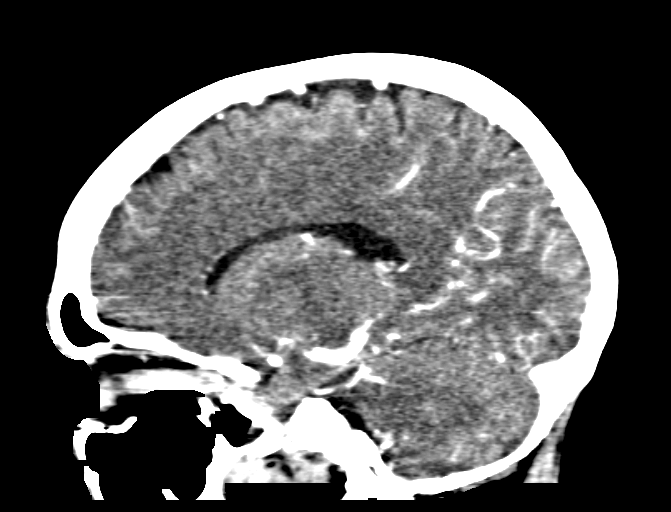
[im 88/146  brain]
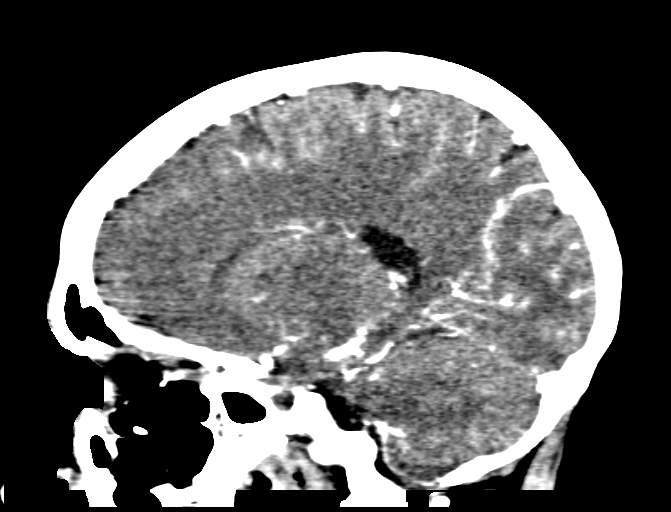

[16 of 47 positions shown; findings below may reference images not displayed]

FINDINGS: CT HEAD

There is no acute intracranial hemorrhage or infarct. No mass lesion
or midline shift. Gray-white matter differentiation is well
maintained. Ventricles are normal in size without evidence of
hydrocephalus. CSF containing spaces are within normal limits. No
extra-axial fluid collection.

The calvarium is intact.

Orbital soft tissues are within normal limits.

Small retention cyst within the lateral recess of the right sphenoid
sinus. Paranasal sinuses are otherwise clear. No mastoid effusion.

Scalp soft tissues are unremarkable.

CTA HEAD

Anterior circulation: Visualized distal cervical segments of the
internal carotid arteries are widely patent. Petrous, cavernous and
supraclinoid segments are widely patent without flow limiting
stenosis. 2-3 mm focal outpouching arising from the distal
cavernous/ supraclinoid right ICA at the expected origin of the
right posterior communicating artery present (Series 11, image 67).
This is directed inferiorly and slightly posteriorly. Unclear
whether this reflects a small aneurysm 0 versus a normal
infundibulum related to a non visible hypoplastic right posterior
communicating artery.

A1 segments, anterior communicating artery common anterior cerebral
arteries within normal limits.

M1 segments patent without stenosis or occlusion. MCA bifurcations
normal. Distal MCA branches well opacified and symmetric.

Posterior circulation: Left vertebral artery is dominant and widely
patent to the vertebrobasilar junction. Right vertebral artery
diminutive but grossly patent as well. Posterior inferior cerebral
arteries patent. Basilar artery widely patent. Superior cerebellar
arteries well opacified bilaterally. Right PCA arises from the
basilar artery and is well opacified to its distal aspect. There is
a somewhat hypoplastic and tortuous left P1 segment with a prominent
left posterior communicating artery. Left PCA also opacified to its
distal aspect.

Venous sinuses: Patent without evidence for venous sinus thrombosis.

Anatomic variants: No anatomic variant.

Delayed phase:No pathologic enhancement on delayed sequence.
IMPRESSION: 1. No acute intracranial process.
2. 2-3 mm focal outpouching arising from the distal
cavernous/supraclinoid right ICA, which may reflect a small aneurysm
versus normal infundibulum related to a hypoplastic right posterior
communicating artery.
3. Otherwise normal CTA of the intracranial circulation.

## 2018-08-09 ENCOUNTER — Other Ambulatory Visit: Payer: Self-pay | Admitting: Internal Medicine

## 2018-08-09 DIAGNOSIS — K580 Irritable bowel syndrome with diarrhea: Secondary | ICD-10-CM

## 2018-09-09 ENCOUNTER — Other Ambulatory Visit: Payer: Self-pay | Admitting: Internal Medicine

## 2018-09-09 DIAGNOSIS — F320 Major depressive disorder, single episode, mild: Secondary | ICD-10-CM

## 2018-09-09 DIAGNOSIS — F3341 Major depressive disorder, recurrent, in partial remission: Secondary | ICD-10-CM

## 2018-09-17 ENCOUNTER — Encounter: Payer: Self-pay | Admitting: Internal Medicine

## 2018-09-20 ENCOUNTER — Ambulatory Visit (INDEPENDENT_AMBULATORY_CARE_PROVIDER_SITE_OTHER): Payer: BLUE CROSS/BLUE SHIELD

## 2018-09-20 DIAGNOSIS — Z23 Encounter for immunization: Secondary | ICD-10-CM | POA: Diagnosis not present

## 2018-10-25 ENCOUNTER — Other Ambulatory Visit: Payer: Self-pay

## 2018-10-25 ENCOUNTER — Ambulatory Visit (INDEPENDENT_AMBULATORY_CARE_PROVIDER_SITE_OTHER): Payer: BLUE CROSS/BLUE SHIELD | Admitting: Internal Medicine

## 2018-10-25 ENCOUNTER — Encounter: Payer: Self-pay | Admitting: Internal Medicine

## 2018-10-25 VITALS — BP 112/72 | HR 87 | Ht 63.0 in | Wt 191.4 lb

## 2018-10-25 DIAGNOSIS — F17200 Nicotine dependence, unspecified, uncomplicated: Secondary | ICD-10-CM | POA: Insufficient documentation

## 2018-10-25 DIAGNOSIS — Z8249 Family history of ischemic heart disease and other diseases of the circulatory system: Secondary | ICD-10-CM | POA: Diagnosis not present

## 2018-10-25 DIAGNOSIS — F3341 Major depressive disorder, recurrent, in partial remission: Secondary | ICD-10-CM | POA: Diagnosis not present

## 2018-10-25 DIAGNOSIS — R079 Chest pain, unspecified: Secondary | ICD-10-CM | POA: Diagnosis not present

## 2018-10-25 DIAGNOSIS — R9089 Other abnormal findings on diagnostic imaging of central nervous system: Secondary | ICD-10-CM

## 2018-10-25 NOTE — Progress Notes (Signed)
Date:  10/25/2018   Name:  Brandi Dominguez   DOB:  30-Jan-1971   MRN:  [id number]   Chief Complaint: Depression (Follow up. PHQ9- 10) and Chest Pain (X 2 days. Stabbing pains. Still having today. Started last week and had sweating, nauseous, and dizziness and then went away after 5-10 mins. No sob or nausea today. )  Chest Pain   This is a recurrent problem. The current episode started more than 1 month ago. The problem occurs intermittently. The problem has been unchanged. The pain is present in the substernal region. The pain is at a severity of 3/10. The pain is mild. The quality of the pain is described as stabbing and burning. The pain does not radiate. Associated symptoms include nausea. Pertinent negatives include no cough, dizziness, exertional chest pressure, fever, headaches, irregular heartbeat, near-syncope, palpitations, shortness of breath, vomiting or weakness. The pain is aggravated by nothing (possibly related to anxiety and stress). She has tried nothing for the symptoms. Risk factors include smoking/tobacco exposure.  Her family medical history is significant for CAD (many member before age 45).  Depression         This is a chronic problem.  The problem occurs intermittently.The problem is unchanged.  Associated symptoms include decreased concentration and decreased interest.  Associated symptoms include no fatigue, no headaches and no suicidal ideas.     The symptoms are aggravated by family issues (recent birthday of her son who is deceased).  Treatments tried: wellbutrin and buspar.  Compliance with treatment is good. Abnormal CT of brain - done in 2017 showed possible aneurysm.  She was seen by Neurosurgery at [facility] who recommended an MRA in one year (ie 2018) but she followed up. 2017: 2. 2-3 mm focal outpouching arising from the distal cavernous/supraclinoid right ICA, which may reflect a small aneurysm versus normal infundibulum related to a hypoplastic right  posterior communicating artery.  Review of Systems  Constitutional: Negative for chills, fatigue, fever and unexpected weight change.  Respiratory: Negative for cough, choking, chest tightness and shortness of breath.   Cardiovascular: Positive for chest pain. Negative for palpitations, leg swelling and near-syncope.  Gastrointestinal: Positive for nausea. Negative for vomiting.  Skin: Negative for rash.  Neurological: Negative for dizziness, weakness and headaches.  Psychiatric/Behavioral: Positive for decreased concentration, depression and dysphoric mood. Negative for sleep disturbance and suicidal ideas. The patient is nervous/anxious.     Patient Active Problem List   Diagnosis Date Noted  . Status post total thyroidectomy 03/25/2018  . Multiple thyroid nodules 01/18/2018  . Arthralgia of both hands 08/20/2017  . 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 Mild single current episode of major depressive disorder (HCC) 10/11/2016  . Irritable bowel syndrome 09/01/2016  . Abnormal computed tomography angiography (CTA) 09/01/2016  . Personal history of peptic ulcer disease 09/01/2016  . GERD (gastroesophageal reflux disease) 09/01/2016    Allergies  Allergen Reactions  . Diflucan [Fluconazole] Anaphylaxis  . Venlafaxine Anxiety and Palpitations    Past Surgical History:  Procedure Laterality Date  . CHOLECYSTECTOMY  2012  . THYROIDECTOMY N/A 03/25/2018   Procedure: THYROIDECTOMY;  Surgeon: Vernie Murders, MD;  Location: [facility] ORS;  Service: ENT;  Laterality: N/A;  . TONSILLECTOMY AND ADENOIDECTOMY      Social History   Tobacco Use  . Smoking status: Current Every Day Smoker    Packs/day: 0.50    Years: 15.00    Pack years: 7.50    Types: Cigarettes  . Smokeless tobacco: Never Used  Substance Use Topics  . Alcohol use: No  . Drug use: No     Medication list has been reviewed and updated.  Current Meds  Medication Sig  . acetaminophen (TYLENOL) 325 MG tablet Take  650 mg by mouth every 6 (six) hours as needed (for pain/headaches.).  Marland Kitchen buPROPion (WELLBUTRIN XL) 150 MG 24 hr tablet TAKE 2 TABLETS DAILY  . busPIRone (BUSPAR) 10 MG tablet TAKE 1 TABLET TWICE A DAY  . diphenoxylate-atropine (LOMOTIL) 2.5-0.025 MG tablet Take 1 tablet by mouth 4 (four) times daily as needed for diarrhea or loose stools.  . hyoscyamine (LEVSIN, ANASPAZ) 0.125 MG tablet Take 1 tablet (0.125 mg total) by mouth every 4 (four) hours as needed (for abdominal pain/spasms.).  Marland Kitchen levothyroxine (SYNTHROID, LEVOTHROID) 200 MCG tablet Take 200 mcg by mouth daily before breakfast.  . liothyronine (CYTOMEL) 5 MCG tablet Take 15 mcg by mouth daily.  . Omeprazole 20 MG TBDD Take 20 mg by mouth daily.  Marland Kitchen triamcinolone cream (KENALOG) 0.1 % Apply 1 application topically 2 (two) times daily. (Patient taking differently: Apply 1 application topically 2 (two) times daily as needed (for rash due to sun exposure. (polymorphic light eruption)). )    PHQ 2/9 Scores 10/25/2018 01/14/2018 08/13/2017  PHQ - 2 Score 2 0 6  PHQ- 9 Score 10 0 12    Physical Exam  Constitutional: She is oriented to person, place, and time. She appears well-developed. No distress.  HENT:  Head: Normocephalic and atraumatic.  Neck: Normal range of motion. Neck supple. Carotid bruit is not present.  Cardiovascular: Normal rate, regular rhythm and normal pulses.  No murmur heard. Pulmonary/Chest: Effort normal. No respiratory distress. She has no decreased breath sounds. She has no wheezes. She has no rales.  Abdominal: Soft. Normal appearance and bowel sounds are normal. There is no tenderness.  Musculoskeletal: Normal range of motion.  Lymphadenopathy:    She has no cervical adenopathy.  Neurological: She is alert and oriented to person, place, and time.  Skin: Skin is warm and dry. No rash noted.  Psychiatric: She has a normal mood and affect. Her behavior is normal. Thought content normal.  Nursing note and vitals  reviewed.   BP 112/72 (BP Location: Right Arm, Patient Position: Sitting, Cuff Size: Normal)   Pulse 87   Ht 5\' 3"  (1.6 m)   Wt 191 lb 6.4 oz (86.8 kg)   SpO2 100%   BMI 33.90 kg/m   Assessment and Plan: 1. Chest pain, unspecified type - EKG 12-Lead - NSR, WNL - Ambulatory referral to Cardiology  2. Major depressive disorder, recurrent episode, in partial remission (HCC) Continue current therapy  3. Family history of premature CAD - Ambulatory referral to Cardiology  4. Tobacco use disorder Smoking cessation counseling provided - Ambulatory referral to Cardiology  5. Abnormal CT of brain - MR MRA NECK W CONTRAST; Future   Partially dictated using Animal nutritionist. Any errors are unintentional.  Bari Edward, MD [facility]  10/25/2018

## 2018-10-26 LAB — BASIC METABOLIC PANEL
BUN/Creatinine Ratio: 22 (ref 9–23)
BUN: 19 mg/dL (ref 6–24)
CO2: 24 mmol/L (ref 20–29)
Calcium: 9.2 mg/dL (ref 8.7–10.2)
Chloride: 104 mmol/L (ref 96–106)
Creatinine, Ser: 0.88 mg/dL (ref 0.57–1.00)
GFR calc Af Amer: 92 mL/min/{1.73_m2} (ref >59)
GFR calc non Af Amer: 80 mL/min/{1.73_m2} (ref >59)
Glucose: 82 mg/dL (ref 65–99)
Potassium: 4.7 mmol/L (ref 3.5–5.2)
Sodium: 144 mmol/L (ref 134–144)

## 2018-10-28 ENCOUNTER — Ambulatory Visit
Admission: RE | Admit: 2018-10-28 | Discharge: 2018-10-28 | Disposition: A | Discharge status: Home / Self Care | Payer: BLUE CROSS/BLUE SHIELD | Source: Ambulatory Visit | Attending: Internal Medicine | Admitting: Internal Medicine

## 2018-10-28 ENCOUNTER — Telehealth: Payer: Self-pay

## 2018-10-28 ENCOUNTER — Other Ambulatory Visit: Payer: Self-pay

## 2018-10-28 DIAGNOSIS — I720 Aneurysm of carotid artery: Secondary | ICD-10-CM | POA: Diagnosis not present

## 2018-10-28 NOTE — Telephone Encounter (Signed)
Imaging from [facility] called stating we needed to change order for MRA of neck to MRA of head without contrast. Changed in system.

## 2018-10-29 ENCOUNTER — Encounter: Payer: Self-pay | Admitting: Internal Medicine

## 2018-10-29 NOTE — Telephone Encounter (Signed)
Patient my chart message. Imaging called yesterday stating we needed to change orders to MRA of the Head instead of neck. This is what was done for patient.   Please Advise.

## 2018-12-08 ENCOUNTER — Other Ambulatory Visit: Payer: Self-pay | Admitting: Internal Medicine

## 2018-12-08 DIAGNOSIS — F320 Major depressive disorder, single episode, mild: Secondary | ICD-10-CM

## 2018-12-08 DIAGNOSIS — F3341 Major depressive disorder, recurrent, in partial remission: Secondary | ICD-10-CM

## 2018-12-16 NOTE — Telephone Encounter (Signed)
Patient will call back to reschedule-ah12/23

## 2019-03-12 ENCOUNTER — Other Ambulatory Visit: Payer: Self-pay | Admitting: Internal Medicine

## 2019-03-12 DIAGNOSIS — F320 Major depressive disorder, single episode, mild: Secondary | ICD-10-CM

## 2019-03-12 DIAGNOSIS — F3341 Major depressive disorder, recurrent, in partial remission: Secondary | ICD-10-CM

## 2019-05-21 ENCOUNTER — Encounter: Payer: Self-pay | Admitting: Internal Medicine

## 2019-05-30 ENCOUNTER — Encounter: Payer: Self-pay | Admitting: Internal Medicine

## 2019-05-30 ENCOUNTER — Ambulatory Visit: Payer: BLUE CROSS/BLUE SHIELD | Admitting: Internal Medicine

## 2019-05-30 ENCOUNTER — Other Ambulatory Visit: Payer: Self-pay

## 2019-05-30 VITALS — BP 128/74 | HR 101 | Ht 63.0 in | Wt 207.0 lb

## 2019-05-30 DIAGNOSIS — K580 Irritable bowel syndrome with diarrhea: Secondary | ICD-10-CM

## 2019-05-30 DIAGNOSIS — F410 Panic disorder [episodic paroxysmal anxiety] without agoraphobia: Secondary | ICD-10-CM

## 2019-05-30 DIAGNOSIS — F320 Major depressive disorder, single episode, mild: Secondary | ICD-10-CM | POA: Diagnosis not present

## 2019-05-30 DIAGNOSIS — S83241A Other tear of medial meniscus, current injury, right knee, initial encounter: Secondary | ICD-10-CM

## 2019-05-30 DIAGNOSIS — L03113 Cellulitis of right upper limb: Secondary | ICD-10-CM | POA: Diagnosis not present

## 2019-05-30 DIAGNOSIS — S83206A Unspecified tear of unspecified meniscus, current injury, right knee, initial encounter: Secondary | ICD-10-CM | POA: Insufficient documentation

## 2019-05-30 MED ORDER — CLONAZEPAM 0.5 MG PO TABS: 0.5000 mg | ORAL_TABLET | Freq: Two times a day (BID) | ORAL | 0 refills | Status: DC | PRN

## 2019-05-30 MED ORDER — DIPHENOXYLATE-ATROPINE 2.5-0.025 MG PO TABS: 1.0000 | ORAL_TABLET | Freq: Four times a day (QID) | ORAL | 1 refills | Status: DC | PRN

## 2019-05-30 MED ORDER — MUPIROCIN 2 % EX OINT: 1.0000 "application " | TOPICAL_OINTMENT | Freq: Two times a day (BID) | CUTANEOUS | 0 refills | Status: DC

## 2019-05-30 MED ORDER — BUPROPION HCL ER (XL) 150 MG PO TB24: 300.0000 mg | ORAL_TABLET | Freq: Every day | ORAL | 1 refills | Status: DC

## 2019-05-30 MED ORDER — BUSPIRONE HCL 15 MG PO TABS: 15.0000 mg | ORAL_TABLET | Freq: Two times a day (BID) | ORAL | 1 refills | Status: DC

## 2019-05-30 NOTE — Progress Notes (Signed)
Date:  05/30/2019   Name:  Brandi Dominguez   DOB:  11/02/1971   MRN:  [id number]   Chief Complaint: Depression (follow up. phq9- 14 )  Depression         This is a chronic problem.  The problem has been resolved since onset.  Associated symptoms include myalgias.  Associated symptoms include no fatigue and no headaches.  Treatments tried: buspar and bupropion'  Compliance with treatment is good.  Previous treatment provided significant relief.  Past medical history includes thyroid problem and anxiety.   Thyroid Problem  Presents for follow-up (followed by ENT; s/p total thyroidectomy) visit. Symptoms include anxiety (having more panic attacks recently). Patient reports no constipation, diarrhea, fatigue or palpitations. The symptoms have been improving.  Anxiety  Presents for follow-up visit. Symptoms include nervous/anxious behavior (having more panic attacks recently) and panic (more panic attacks). Patient reports no chest pain, dizziness, nausea, palpitations or shortness of breath. Symptoms occur occasionally. The severity of symptoms is causing significant distress (has used clonazepam in the past ). The quality of sleep is fair.    Rash  This is a new problem. The current episode started in the past 7 days. The problem has been gradually improving since onset. The affected locations include the right arm. The rash is characterized by blistering, redness and itchiness. She was exposed to an insect bite/sting. Pertinent negatives include no cough, diarrhea, fatigue, fever or shortness of breath.    Review of Systems  Constitutional: Positive for unexpected weight change. Negative for chills, fatigue and fever.  Respiratory: Negative for cough, chest tightness, shortness of breath and wheezing.   Cardiovascular: Negative for chest pain, palpitations and leg swelling.  Gastrointestinal: Negative for abdominal pain, constipation, diarrhea and nausea.  Musculoskeletal: Positive for  arthralgias (tear of right knee meniscus) and myalgias. Negative for joint swelling.  Skin: Positive for rash.  Neurological: Negative for dizziness and headaches.  Hematological: Negative for adenopathy.  Psychiatric/Behavioral: Positive for depression and dysphoric mood. Negative for sleep disturbance. The patient is nervous/anxious (having more panic attacks recently).     Patient Active Problem List   Diagnosis Date Noted  . Tear of meniscus of right knee 05/30/2019  . Major depressive disorder, recurrent episode, in partial remission (HCC) 10/25/2018  . Tobacco use disorder 10/25/2018  . Family history of premature CAD 10/25/2018  . Status post total thyroidectomy 03/25/2018  . Multiple thyroid nodules 01/18/2018  . Arthralgia of both hands 08/20/2017  . 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 Irritable bowel syndrome 09/01/2016  . Personal history of peptic ulcer disease 09/01/2016  . GERD (gastroesophageal reflux disease) 09/01/2016    Allergies  Allergen Reactions  . Diflucan [Fluconazole] Anaphylaxis  . Venlafaxine Anxiety and Palpitations    Past Surgical History:  Procedure Laterality Date  . CHOLECYSTECTOMY  2012  . THYROIDECTOMY N/A 03/25/2018   Procedure: THYROIDECTOMY;  Surgeon: Vernie MurdersJuengel, Paul, MD;  Location: [facility] ORS;  Service: ENT;  Laterality: N/A;  . TONSILLECTOMY AND ADENOIDECTOMY      Social History   Tobacco Use  . Smoking status: Current Every Day Smoker    Packs/day: 0.50    Years: 15.00    Pack years: 7.50    Types: Cigarettes  . Smokeless tobacco: Never Used  Substance Use Topics  . Alcohol use: No  . Drug use: No     Medication list has been reviewed and updated.  Current Meds  Medication Sig  . acetaminophen (TYLENOL) 325 MG tablet Take  650 mg by mouth every 6 (six) hours as needed (for pain/headaches.).  Marland Kitchen buPROPion (WELLBUTRIN XL) 150 MG 24 hr tablet Take 2 tablets (300 mg total) by mouth daily.  . diphenoxylate-atropine (LOMOTIL)  2.5-0.025 MG tablet Take 1 tablet by mouth 4 (four) times daily as needed for diarrhea or loose stools.  . hyoscyamine (LEVSIN, ANASPAZ) 0.125 MG tablet Take 1 tablet (0.125 mg total) by mouth every 4 (four) hours as needed (for abdominal pain/spasms.).  Marland Kitchen levothyroxine (SYNTHROID, LEVOTHROID) 200 MCG tablet Take 250 mcg by mouth daily before breakfast.   . liothyronine (CYTOMEL) 5 MCG tablet Take 20 mcg by mouth daily.   . mupirocin ointment (BACTROBAN) 2 % Place 1 application into the nose 2 (two) times daily.  Marland Kitchen triamcinolone cream (KENALOG) 0.1 % Apply 1 application topically 2 (two) times daily. (Patient taking differently: Apply 1 application topically 2 (two) times daily as needed (for rash due to sun exposure. (polymorphic light eruption)). )  . [DISCONTINUED] buPROPion (WELLBUTRIN XL) 150 MG 24 hr tablet TAKE 2 TABLETS DAILY  . [DISCONTINUED] busPIRone (BUSPAR) 10 MG tablet TAKE 1 TABLET TWICE A DAY  . [DISCONTINUED] diphenoxylate-atropine (LOMOTIL) 2.5-0.025 MG tablet Take 1 tablet by mouth 4 (four) times daily as needed for diarrhea or loose stools.  . [DISCONTINUED] mupirocin ointment (BACTROBAN) 2 % Place 1 application into the nose 2 (two) times daily.    PHQ 2/9 Scores 05/30/2019 10/25/2018 01/14/2018 08/13/2017  PHQ - 2 Score 5 2 0 6  PHQ- 9 Score 14 10 0 12    BP Readings from Last 3 Encounters:  05/30/19 128/74  10/25/18 112/72  03/26/18 121/72    Physical Exam Vitals signs and nursing note reviewed.  Constitutional:      General: She is not in acute distress.    Appearance: Normal appearance. She is well-developed.  HENT:     Head: Normocephalic and atraumatic.     Mouth/Throat:     Mouth: Mucous membranes are moist.  Neck:     Musculoskeletal: Normal range of motion and neck supple.  Cardiovascular:     Rate and Rhythm: Normal rate and regular rhythm.     Pulses: Normal pulses.  Pulmonary:     Effort: Pulmonary effort is normal. No respiratory distress.   Musculoskeletal: Normal range of motion.     Right knee: She exhibits no swelling and no effusion. Tenderness found. Medial joint line tenderness noted.     Right ankle: She exhibits normal range of motion and no swelling. Tenderness.     Left ankle: She exhibits normal range of motion and no swelling. Tenderness.     Right lower leg: No edema.     Left lower leg: No edema.  Lymphadenopathy:     Cervical: No cervical adenopathy.  Skin:    General: Skin is warm and dry.     Capillary Refill: Capillary refill takes less than 2 seconds.     Findings: No rash.       Neurological:     Mental Status: She is alert and oriented to person, place, and time.  Psychiatric:        Behavior: Behavior normal.        Thought Content: Thought content normal.     Wt Readings from Last 3 Encounters:  05/30/19 207 lb (93.9 kg)  10/25/18 191 lb 6.4 oz (86.8 kg)  03/25/18 185 lb (83.9 kg)    BP 128/74   Pulse (!) 101   Ht  (1.6  m)   Wt 207 lb (93.9 kg)   SpO2 97%   BMI 36.67 kg/m   Assessment and Plan: 1. Mild single current episode of major depressive disorder (HCC) Will increase dose of buspar to 15 mg bid - busPIRone (BUSPAR) 15 MG tablet; Take 1 tablet (15 mg total) by mouth 2 (two) times daily.  Dispense: 180 tablet; Refill: 1 - buPROPion (WELLBUTRIN XL) 150 MG 24 hr tablet; Take 2 tablets (300 mg total) by mouth daily.  Dispense: 180 tablet; Refill: 1  2. Panic attacks Use clonazepam PRN - clonazePAM (KLONOPIN) 0.5 MG tablet; Take 1 tablet (0.5 mg total) by mouth 2 (two) times daily as needed for anxiety.  Dispense: 30 tablet; Refill: 0  3. Irritable bowel syndrome with diarrhea Intermittent sx; worse with anxiety - diphenoxylate-atropine (LOMOTIL) 2.5-0.025 MG tablet; Take 1 tablet by mouth 4 (four) times daily as needed for diarrhea or loose stools.  Dispense: 120 tablet; Refill: 1  4. Cellulitis of right upper extremity resolving - mupirocin ointment (BACTROBAN) 2 %;  Place 1 application into the nose 2 (two) times daily.  Dispense: 22 g; Refill: 0  5. Tear of medial meniscus of right knee, current, unspecified tear type, initial encounter Surgery planned for July   Partially dictated using [employer]. Any errors are unintentional.  Bari Edward, MD [facility]  05/30/2019

## 2019-10-02 ENCOUNTER — Encounter: Payer: Self-pay | Admitting: Internal Medicine

## 2019-10-03 ENCOUNTER — Ambulatory Visit: Payer: BLUE CROSS/BLUE SHIELD

## 2019-11-10 ENCOUNTER — Other Ambulatory Visit: Payer: Self-pay | Admitting: Internal Medicine

## 2019-11-10 DIAGNOSIS — F320 Major depressive disorder, single episode, mild: Secondary | ICD-10-CM

## 2019-11-28 ENCOUNTER — Other Ambulatory Visit: Payer: Self-pay | Admitting: Internal Medicine

## 2019-11-28 DIAGNOSIS — F320 Major depressive disorder, single episode, mild: Secondary | ICD-10-CM

## 2020-01-02 IMAGING — US US THYROID
1 series · 12 of 25 positions shown · non-contrast
Comparison: None.

CLINICAL DATA: 46-year-old female with a history of enlarged
thyroid gland

EXAM:
THYROID ULTRASOUND
TECHNIQUE: Ultrasound examination of the thyroid gland and adjacent soft
tissues was performed.

[Series 1: us thyroid · 0.05mm/px · 12 of 59 slices shown]
[im 3/59]
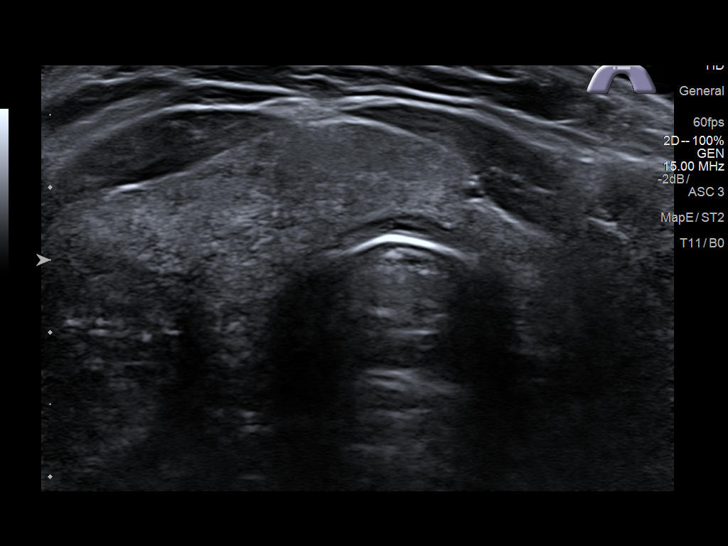
[im 8/59]
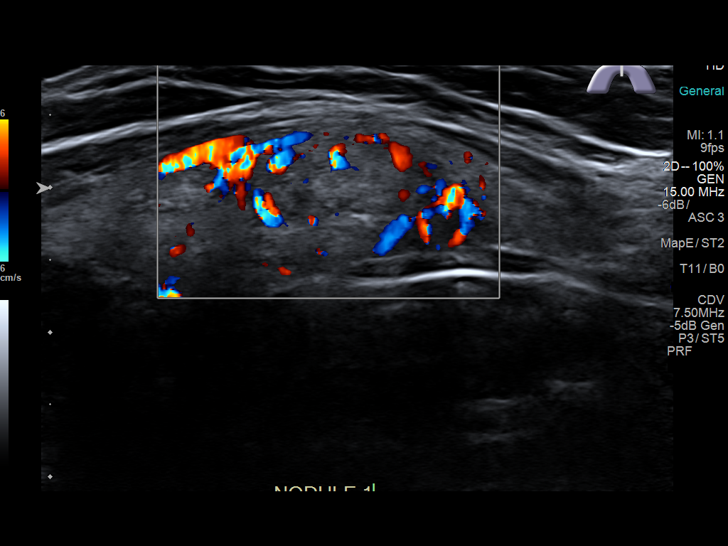
[im 13/59]
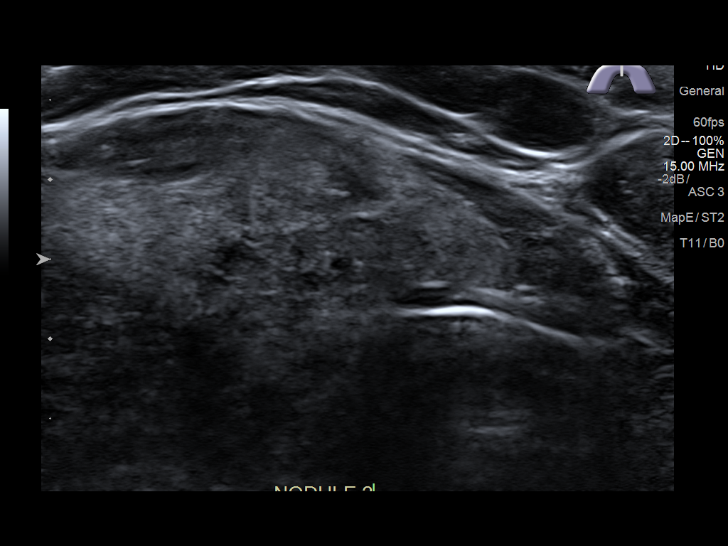
[im 17/59]
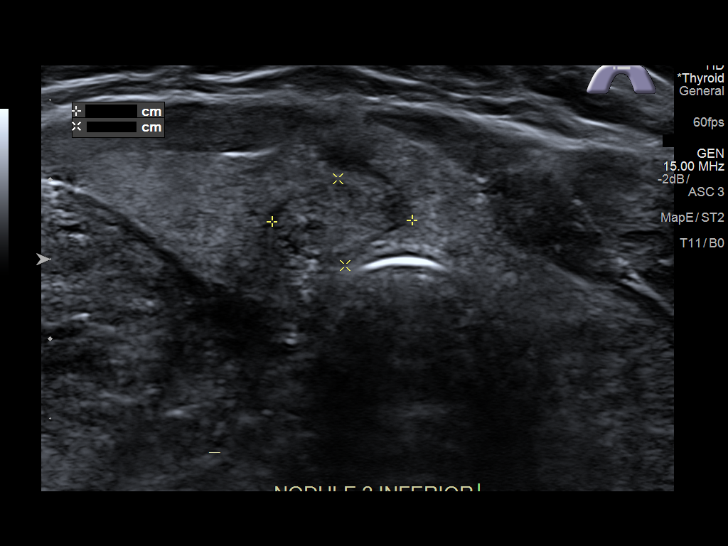
[im 22/59]
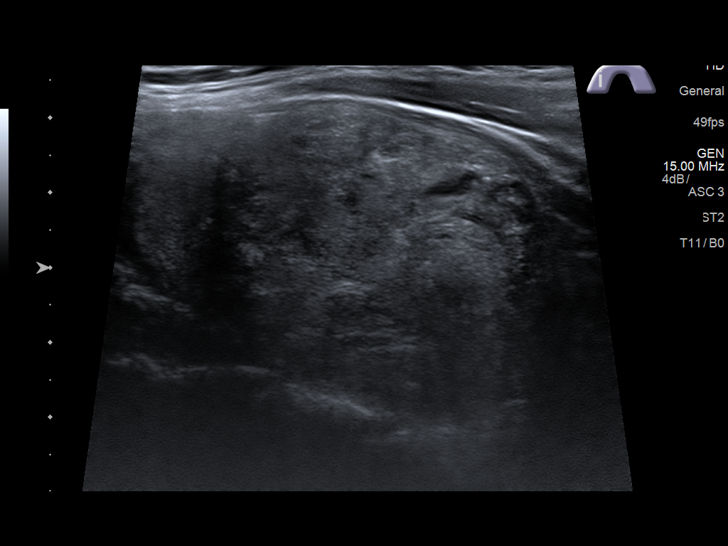
[im 27/59]
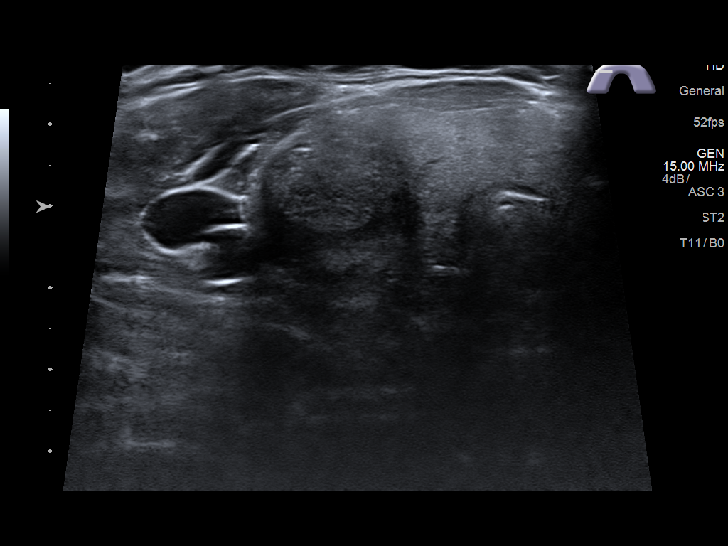
[im 32/59]
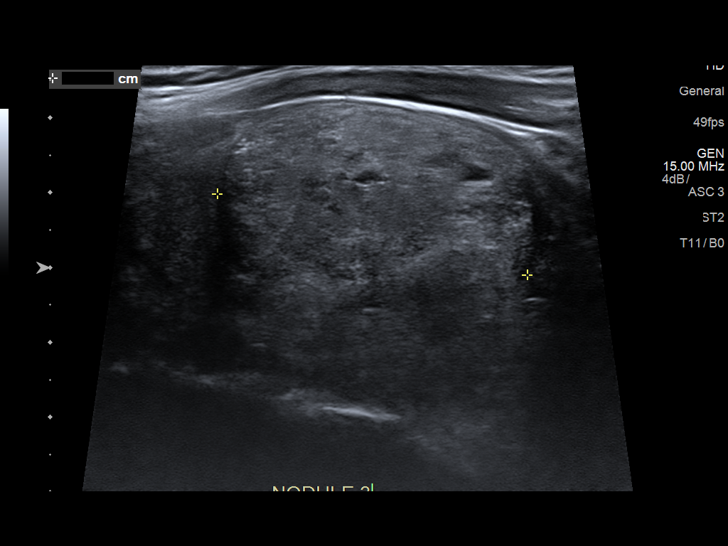
[im 37/59]
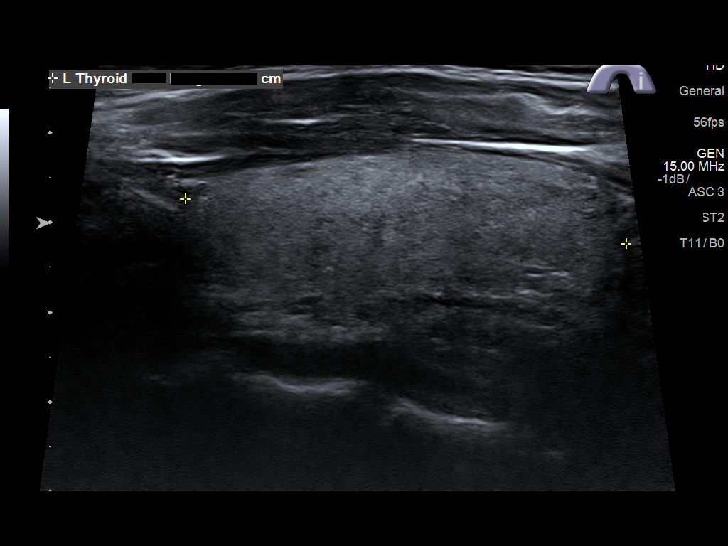
[im 42/59]
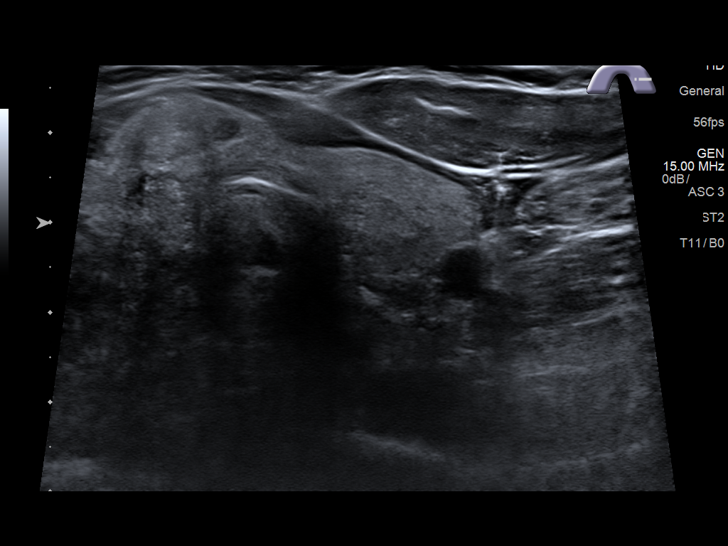
[im 46/59]
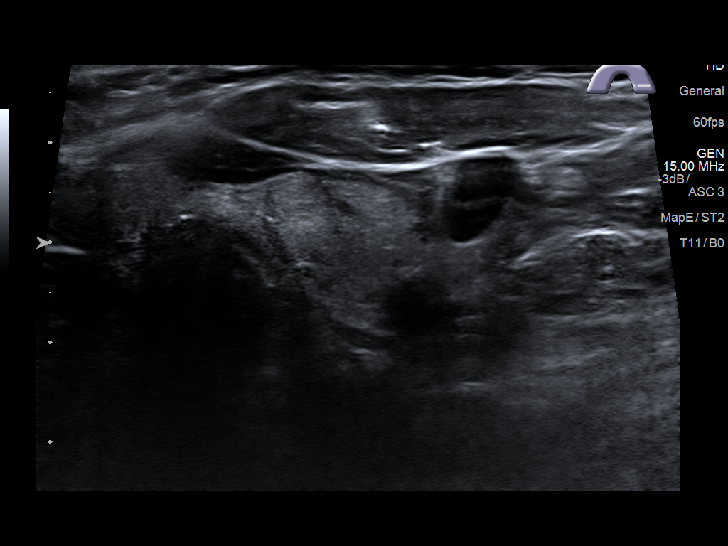
[im 51/59]
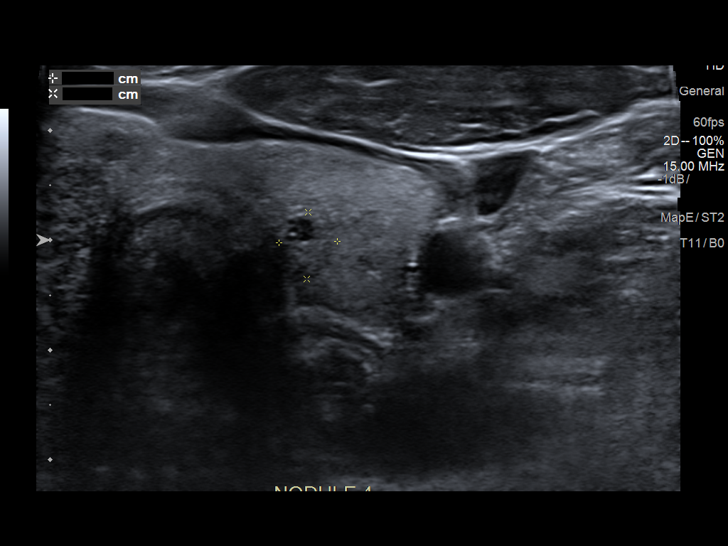
[im 56/59]
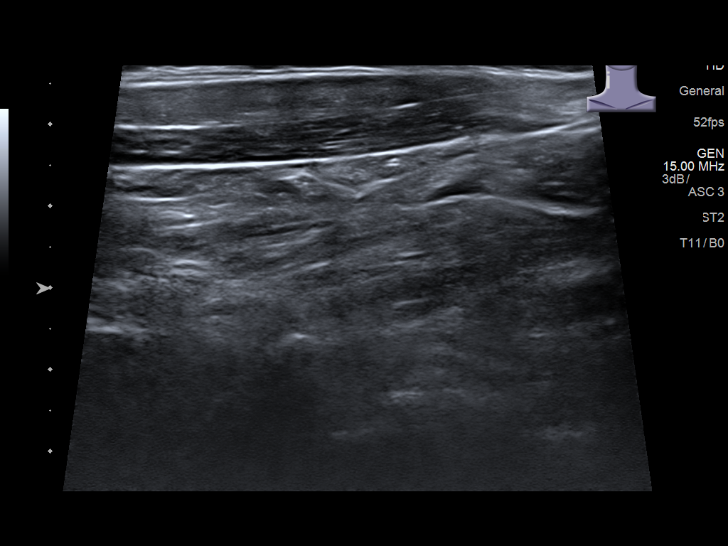

[12 of 25 positions shown; findings below may reference images not displayed]

FINDINGS: Parenchymal Echotexture: Mildly heterogenous

Isthmus: 0.7 cm

Right lobe: 6.9 cm x 4.0 cm x 3.6 cm

Left lobe: 4.9 cm x 1.5 cm x 1.7 cm

_________________________________________________________

Estimated total number of nodules >/= 1 cm: 2

Number of spongiform nodules >/=  2 cm not described below (TR1): 0

Number of mixed cystic and solid nodules >/= 1.5 cm not described
below (TR2): 0

_________________________________________________________

Nodule # 1:

Location: Isthmus; Mid

Maximum size: 1.5 cm; Other 2 dimensions: 1.0 cm x 0.6 cm

Composition: solid/almost completely solid (2)

Echogenicity: isoechoic (1)

Shape: not taller-than-wide (0)

Margins: ill-defined (0)

Echogenic foci: none (0)

ACR TI-RADS total points: 3.

ACR TI-RADS risk category: TR3 (3 points).

ACR TI-RADS recommendations:

Nodule meets criteria for surveillance

_________________________________________________________

Nodule # 2:

Location: Isthmus; Mid

Maximum size: 0.9 cm; Other 2 dimensions: 0.9 cm x 0.6 cm

Composition: solid/almost completely solid (2)

Echogenicity: isoechoic (1)

Shape: not taller-than-wide (0)

Margins: ill-defined (0)

Echogenic foci: none (0)

ACR TI-RADS total points: 3.

ACR TI-RADS risk category: TR3 (3 points).

ACR TI-RADS recommendations:

Nodule does not meet criteria for surveillance or biopsy

_________________________________________________________

Nodule # 3:

Location: Right; Mid

Maximum size: 4.3 cm; Other 2 dimensions: 3.9 cm x 3.6 cm

Composition: solid/almost completely solid (2)

Echogenicity: isoechoic (1)

Shape: taller-than-wide (3)

Margins: ill-defined (0)

Echogenic foci: none (0)

ACR TI-RADS total points: 6.

ACR TI-RADS risk category: TR4 (4-6 points).

ACR TI-RADS recommendations:

Nodule meets criteria for biopsy

_________________________________________________________

Nodule # 4:

Location: Left; Mid

Maximum size: 0.6 cm; Other 2 dimensions: 0.5 cm x 0.5 cm

Composition: mixed cystic and solid (1)

Echogenicity: isoechoic (1)

Shape: taller-than-wide (3)

Margins: ill-defined (0)

Echogenic foci: none (0)

ACR TI-RADS total points: 5.

ACR TI-RADS risk category: TR4 (4-6 points).

ACR TI-RADS recommendations:

Nodule does not meet criteria for surveillance or biopsy

_________________________________________________________

Nodule # 5:

Location: Left; Inferior

Maximum size: 0.8 cm; Other 2 dimensions: 0.6 cm x 0.4 cm

Composition: cannot determine (2)

Echogenicity: hypoechoic (2)

Shape: not taller-than-wide (0)

Margins: ill-defined (0)

Echogenic foci: none (0)

ACR TI-RADS total points: 4.

ACR TI-RADS risk category: TR4 (4-6 points).

ACR TI-RADS recommendations:

Nodule does not meet criteria for surveillance or biopsy

_________________________________________________________

No adenopathy
IMPRESSION: Right mid thyroid nodule (labeled 3) meets criteria for biopsy, as
designated by the newly established ACR TI-RADS criteria, and
referral for biopsy is recommended.

Isthmic nodule (labeled 1) meets criteria for surveillance, as
designated by the newly established ACR TI-RADS criteria.
Surveillance ultrasound study recommended to be performed annually
up to 5 years.

Recommendations follow those established by the new ACR TI-RADS
criteria ([HOSPITAL] 6710;[DATE]).

## 2020-02-13 LAB — HM PAP SMEAR: HM Pap smear: NEGATIVE

## 2020-02-19 ENCOUNTER — Other Ambulatory Visit: Payer: Self-pay

## 2020-02-19 ENCOUNTER — Encounter: Payer: Self-pay | Admitting: Internal Medicine

## 2020-02-19 MED ORDER — TRIAMCINOLONE ACETONIDE 0.1 % EX CREA: 1.0000 "application " | TOPICAL_CREAM | Freq: Two times a day (BID) | CUTANEOUS | 1 refills | Status: DC | PRN

## 2020-02-24 ENCOUNTER — Other Ambulatory Visit: Payer: Self-pay | Admitting: Internal Medicine

## 2020-02-24 DIAGNOSIS — F320 Major depressive disorder, single episode, mild: Secondary | ICD-10-CM

## 2020-05-06 ENCOUNTER — Other Ambulatory Visit: Payer: Self-pay | Admitting: Internal Medicine

## 2020-05-06 DIAGNOSIS — F320 Major depressive disorder, single episode, mild: Secondary | ICD-10-CM

## 2020-05-06 NOTE — Telephone Encounter (Signed)
Requested Prescriptions  Pending Prescriptions Disp Refills  . busPIRone (BUSPAR) 15 MG tablet [Pharmacy Med Name: BUSPIRONE HCL TABS 15MG ] 180 tablet 3    Sig: TAKE 1 TABLET TWICE A DAY     Psychiatry: Anxiolytics/Hypnotics - Non-controlled Failed - 05/06/2020 12:19 AM      Failed - Valid encounter within last 6 months    Recent Outpatient Visits          11 months ago Panic attacks   [facility] 05/08/2020, MD   1 year ago Chest pain, unspecified type   [facility], MD   2 years ago Enlarged thyroid gland   [facility], MD   2 years ago Arthralgia of multiple joints   [facility], MD   3 years ago Depression   [facility], MD

## 2020-10-25 ENCOUNTER — Ambulatory Visit: Payer: BC Managed Care – PPO | Admitting: Internal Medicine

## 2020-10-25 ENCOUNTER — Encounter: Payer: Self-pay | Admitting: Internal Medicine

## 2020-10-25 ENCOUNTER — Other Ambulatory Visit: Payer: Self-pay

## 2020-10-25 VITALS — BP 104/60 | HR 97 | Temp 98.0°F | Ht 63.0 in | Wt 220.0 lb

## 2020-10-25 DIAGNOSIS — F3341 Major depressive disorder, recurrent, in partial remission: Secondary | ICD-10-CM

## 2020-10-25 DIAGNOSIS — Z6838 Body mass index (BMI) 38.0-38.9, adult: Secondary | ICD-10-CM

## 2020-10-25 DIAGNOSIS — K219 Gastro-esophageal reflux disease without esophagitis: Secondary | ICD-10-CM | POA: Diagnosis not present

## 2020-10-25 DIAGNOSIS — M2550 Pain in unspecified joint: Secondary | ICD-10-CM | POA: Diagnosis not present

## 2020-10-25 DIAGNOSIS — Z23 Encounter for immunization: Secondary | ICD-10-CM

## 2020-10-25 DIAGNOSIS — I720 Aneurysm of carotid artery: Secondary | ICD-10-CM

## 2020-10-25 DIAGNOSIS — E559 Vitamin D deficiency, unspecified: Secondary | ICD-10-CM

## 2020-10-25 DIAGNOSIS — Z1231 Encounter for screening mammogram for malignant neoplasm of breast: Secondary | ICD-10-CM

## 2020-10-25 DIAGNOSIS — F410 Panic disorder [episodic paroxysmal anxiety] without agoraphobia: Secondary | ICD-10-CM

## 2020-10-25 DIAGNOSIS — R5383 Other fatigue: Secondary | ICD-10-CM

## 2020-10-25 MED ORDER — CLONAZEPAM 0.5 MG PO TABS: 0.5000 mg | ORAL_TABLET | Freq: Two times a day (BID) | ORAL | 0 refills | Status: DC | PRN

## 2020-10-25 NOTE — Progress Notes (Addendum)
Date:  10/25/2020   Name:  Brandi Dominguez   DOB:  06/13/1971   MRN:  [id number]   Chief Complaint: Depression (f/u) and Flu Vaccine  Depression        This is a chronic problem.The problem is unchanged.  Associated symptoms include decreased concentration, fatigue, decreased interest and sad.  Associated symptoms include no headaches and no suicidal ideas.     The symptoms are aggravated by nothing.  Past treatments include other medications (buspar has not helped much).  Past medical history includes thyroid problem.   Gastroesophageal Reflux She complains of abdominal pain and heartburn. She reports no chest pain or no coughing. This is a recurrent problem. The problem occurs frequently. The symptoms are aggravated by certain foods and medications. Associated symptoms include fatigue.  Thyroid Problem Presents for follow-up (Dr. Elenore Rota) visit. Symptoms include anxiety, depressed mood and fatigue. Patient reports no constipation or diarrhea. The symptoms have been stable.    Lab Results  Component Value Date   CREATININE 0.88 10/25/2018   BUN 19 10/25/2018   NA 144 10/25/2018   K 4.7 10/25/2018   CL 104 10/25/2018   CO2 24 10/25/2018   No results found for: CHOL, HDL, LDLCALC, LDLDIRECT, TRIG, CHOLHDL Lab Results  Component Value Date   TSH 1.520 01/14/2018   No results found for: HGBA1C Lab Results  Component Value Date   WBC 9.3 03/19/2018   HGB 13.8 03/19/2018   HCT 42.7 03/19/2018   MCV 84.1 03/19/2018   PLT 275 03/19/2018   No results found for: ALT, AST, GGT, ALKPHOS, BILITOT   Review of Systems  Constitutional: Positive for fatigue. Negative for chills, fever and unexpected weight change.  HENT: Negative for trouble swallowing.   Respiratory: Negative for cough, chest tightness and shortness of breath.   Cardiovascular: Negative for chest pain and leg swelling.  Gastrointestinal: Positive for abdominal pain and heartburn. Negative for blood in stool,  constipation and diarrhea.  Musculoskeletal: Negative for arthralgias and gait problem.  Neurological: Negative for dizziness and headaches.  Psychiatric/Behavioral: Positive for decreased concentration and depression. Negative for sleep disturbance and suicidal ideas. The patient is nervous/anxious.     Patient Active Problem List   Diagnosis Date Noted  . Tear of meniscus of right knee 05/30/2019  . Panic attacks 05/30/2019  . Major depressive disorder, recurrent episode, in partial remission (HCC) 10/25/2018  . Tobacco use disorder 10/25/2018  . Family history of premature CAD 10/25/2018  . Status post total thyroidectomy 03/25/2018  . Multiple thyroid nodules 01/18/2018  . Arthralgia of both hands 08/20/2017  . 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 Irritable bowel syndrome 09/01/2016  . Personal history of peptic ulcer disease 09/01/2016  . GERD (gastroesophageal reflux disease) 09/01/2016    Allergies  Allergen Reactions  . Diflucan [Fluconazole] Anaphylaxis  . Venlafaxine Anxiety and Palpitations    Past Surgical History:  Procedure Laterality Date  . CHOLECYSTECTOMY  2012  . THYROIDECTOMY N/A 03/25/2018   Procedure: THYROIDECTOMY;  Surgeon: Vernie Murders, MD;  Location: [facility] ORS;  Service: ENT;  Laterality: N/A;  . TONSILLECTOMY AND ADENOIDECTOMY      Social History   Tobacco Use  . Smoking status: Current Every Day Smoker    Packs/day: 0.50    Years: 15.00    Pack years: 7.50    Types: Cigarettes  . Smokeless tobacco: Never Used  Vaping Use  . Vaping Use: Never used  Substance Use Topics  . Alcohol use: No  .  Drug use: No     Medication list has been reviewed and updated.  Current Meds  Medication Sig  . acetaminophen (TYLENOL) 325 MG tablet Take 650 mg by mouth every 6 (six) hours as needed (for pain/headaches.).  Marland Kitchen ARMOUR THYROID 180 MG tablet Take 180 mg by mouth daily.  Marland Kitchen buPROPion (WELLBUTRIN XL) 150 MG 24 hr tablet TAKE 2 TABLETS DAILY (NEED  APPOINTMENT FOR FURTHER REFILLS)  . busPIRone (BUSPAR) 15 MG tablet TAKE 1 TABLET TWICE A DAY  . clonazePAM (KLONOPIN) 0.5 MG tablet Take 1 tablet (0.5 mg total) by mouth 2 (two) times daily as needed for anxiety.  . diphenoxylate-atropine (LOMOTIL) 2.5-0.025 MG tablet Take 1 tablet by mouth 4 (four) times daily as needed for diarrhea or loose stools.  . hyoscyamine (LEVSIN, ANASPAZ) 0.125 MG tablet Take 1 tablet (0.125 mg total) by mouth every 4 (four) hours as needed (for abdominal pain/spasms.).  Marland Kitchen liothyronine (CYTOMEL) 5 MCG tablet Take 20 mcg by mouth daily.   Marland Kitchen SYNTHROID 25 MCG tablet Take 100 mcg by mouth daily.   Marland Kitchen triamcinolone cream (KENALOG) 0.1 % Apply 1 application topically 2 (two) times daily as needed (for rash due to sun exposure. (polymorphic light eruption)).  . [DISCONTINUED] Norethindrone Acetate-Ethinyl Estrad-FE (LOESTRIN 24 FE) 1-20 MG-MCG(24) tablet Take 1 tablet by mouth daily.    PHQ 2/9 Scores 10/25/2020 05/30/2019 10/25/2018 01/14/2018  PHQ - 2 Score 6 5 2  0  PHQ- 9 Score 16 14 10  0    GAD 7 : Generalized Anxiety Score 10/25/2020  Nervous, Anxious, on Edge 3  Control/stop worrying 3  Worry too much - different things 3  Trouble relaxing 0  Restless 0  Easily annoyed or irritable 2  Afraid - awful might happen 3  Total GAD 7 Score 14  Anxiety Difficulty Not difficult at all    BP Readings from Last 3 Encounters:  10/25/20 104/60  05/30/19 128/74  10/25/18 112/72    Physical Exam Vitals and nursing note reviewed.  Constitutional:      General: She is not in acute distress.    Appearance: Normal appearance. She is well-developed.  HENT:     Head: Normocephalic and atraumatic.  Neck:     Vascular: No carotid bruit.  Cardiovascular:     Rate and Rhythm: Normal rate and regular rhythm.     Pulses: Normal pulses.     Heart sounds: No murmur heard.   Pulmonary:     Effort: Pulmonary effort is normal. No respiratory distress.     Breath sounds: No  wheezing or rhonchi.  Abdominal:     General: Abdomen is flat.     Palpations: Abdomen is soft.     Tenderness: There is abdominal tenderness. There is no guarding or rebound.  Musculoskeletal:        General: Normal range of motion.     Cervical back: Normal range of motion.     Right lower leg: No edema.     Left lower leg: No edema.  Lymphadenopathy:     Cervical: No cervical adenopathy.  Skin:    General: Skin is warm and dry.     Capillary Refill: Capillary refill takes less than 2 seconds.     Findings: No rash.  Neurological:     General: No focal deficit present.     Mental Status: She is alert and oriented to person, place, and time.  Psychiatric:        Mood and Affect: Mood normal.  Wt Readings from Last 3 Encounters:  10/25/20 220 lb (99.8 kg)  05/30/19 207 lb (93.9 kg)  10/25/18 191 lb 6.4 oz (86.8 kg)    BP 104/60   Pulse 97   Temp 98 F (36.7 C) (Oral)   Ht 5\' 3"  (1.6 m)   Wt 220 lb (99.8 kg)   SpO2 97%   BMI 38.97 kg/m   Assessment and Plan: 1. Major depressive disorder, recurrent episode, in partial remission (HCC) No improvement on Buspar so will d/c Continue Wellbutrin Add Viibryd - starter pack given; will send Rx for 20 mg if tolerated  2. Gastroesophageal reflux disease without esophagitis Not currently on PPI due to thyroid issues Will rule out H Pylori Suggest Pepcid in the evening if needed - H. pylori breath test  3. BMI 38.0-38.9,adult Rule out early DM - Hemoglobin A1c  4. Arthralgia, unspecified joint Uncertain cause No focal joint abnormalities noted  5. Vitamin D deficiency Check levels and advise - VITAMIN D 25 Hydroxy (Vit-D Deficiency, Fractures)  6. Fatigue, unspecified type - Vitamin B12  7. Carotid artery aneurysm [facility]) CT Angio 2017:  Seen in the past by Neurology at [facility] They felt the study was non-diagnostic and recommend MRI in one year but she did not follow up. IMPRESSION: 1. No acute intracranial  process. 2. 2-3 mm focal outpouching arising from the distal cavernous/supraclinoid right ICA, which may reflect a small aneurysm versus normal infundibulum related to a hypoplastic right posterior communicating artery. 3. Otherwise normal CTA of the intracranial circulation.  8. Encounter for screening mammogram for breast cancer To be scheduled at [facility] - MM 3D SCREEN BREAST BILATERAL; Future   Partially dictated using [facility]. Any errors are unintentional.  Animal nutritionist, MD [facility]  10/25/2020

## 2020-10-26 LAB — H. PYLORI BREATH TEST: H pylori Breath Test: POSITIVE — AB

## 2020-10-26 LAB — VITAMIN D 25 HYDROXY (VIT D DEFICIENCY, FRACTURES): Vit D, 25-Hydroxy: 36 ng/mL (ref 30.0–100.0)

## 2020-10-26 LAB — HEMOGLOBIN A1C
Est. average glucose Bld gHb Est-mCnc: 126 mg/dL
Hgb A1c MFr Bld: 6 % — ABNORMAL HIGH (ref 4.8–5.6)

## 2020-10-26 LAB — VITAMIN B12: Vitamin B-12: 225 pg/mL — ABNORMAL LOW (ref 232–1245)

## 2020-10-27 ENCOUNTER — Other Ambulatory Visit: Payer: Self-pay | Admitting: Internal Medicine

## 2020-10-27 ENCOUNTER — Other Ambulatory Visit: Payer: Self-pay

## 2020-10-27 DIAGNOSIS — A048 Other specified bacterial intestinal infections: Secondary | ICD-10-CM

## 2020-10-27 MED ORDER — TERCONAZOLE 0.4 % VA CREA: 1.0000 | TOPICAL_CREAM | Freq: Every day | VAGINAL | 0 refills | Status: DC

## 2020-10-27 MED ORDER — AMOXICILLIN 500 MG PO CAPS: 1000.0000 mg | ORAL_CAPSULE | Freq: Two times a day (BID) | ORAL | 0 refills | Status: AC

## 2020-10-27 MED ORDER — CLARITHROMYCIN 500 MG PO TABS: 500.0000 mg | ORAL_TABLET | Freq: Two times a day (BID) | ORAL | 0 refills | Status: AC

## 2020-10-27 MED ORDER — OMEPRAZOLE 40 MG PO CPDR: 40.0000 mg | DELAYED_RELEASE_CAPSULE | Freq: Two times a day (BID) | ORAL | 1 refills | Status: DC

## 2020-11-05 ENCOUNTER — Ambulatory Visit: Payer: Self-pay

## 2020-11-09 ENCOUNTER — Ambulatory Visit: Payer: Self-pay

## 2020-11-12 ENCOUNTER — Other Ambulatory Visit: Payer: Self-pay

## 2020-11-12 ENCOUNTER — Ambulatory Visit (INDEPENDENT_AMBULATORY_CARE_PROVIDER_SITE_OTHER): Payer: BC Managed Care – PPO | Admitting: Internal Medicine

## 2020-11-12 DIAGNOSIS — E538 Deficiency of other specified B group vitamins: Secondary | ICD-10-CM

## 2020-11-12 DIAGNOSIS — F3341 Major depressive disorder, recurrent, in partial remission: Secondary | ICD-10-CM

## 2020-11-12 MED ORDER — CYANOCOBALAMIN 1000 MCG/ML IJ SOLN: 1000.0000 ug | INTRAMUSCULAR | 3 refills | Status: DC

## 2020-11-12 MED ORDER — VIIBRYD 20 MG PO TABS: 1.0000 | ORAL_TABLET | Freq: Every day | ORAL | 3 refills | Status: DC

## 2020-11-12 MED ORDER — CYANOCOBALAMIN 1000 MCG/ML IJ SOLN
1000.0000 ug | Freq: Once | INTRAMUSCULAR | Status: AC
  Administered 2020-11-12: 1000 ug via INTRAMUSCULAR

## 2020-11-12 MED ORDER — "SYRINGE 25G X 1"" 3 ML MISC": 1.0000 | 0 refills | Status: DC

## 2021-01-07 ENCOUNTER — Encounter: Payer: Self-pay | Admitting: Internal Medicine

## 2021-01-07 NOTE — Telephone Encounter (Signed)
Pt response.  KP

## 2021-04-11 ENCOUNTER — Ambulatory Visit: Payer: BC Managed Care – PPO | Admitting: Internal Medicine

## 2021-04-11 ENCOUNTER — Ambulatory Visit: Payer: Self-pay | Admitting: Internal Medicine

## 2021-04-11 ENCOUNTER — Encounter: Payer: Self-pay | Admitting: Internal Medicine

## 2021-04-11 ENCOUNTER — Other Ambulatory Visit: Payer: Self-pay

## 2021-04-11 VITALS — BP 112/76 | HR 91 | Temp 98.2°F | Ht 63.0 in | Wt 214.0 lb

## 2021-04-11 DIAGNOSIS — L564 Polymorphous light eruption: Secondary | ICD-10-CM | POA: Diagnosis not present

## 2021-04-11 DIAGNOSIS — E538 Deficiency of other specified B group vitamins: Secondary | ICD-10-CM | POA: Diagnosis not present

## 2021-04-11 DIAGNOSIS — F3341 Major depressive disorder, recurrent, in partial remission: Secondary | ICD-10-CM

## 2021-04-11 DIAGNOSIS — F410 Panic disorder [episodic paroxysmal anxiety] without agoraphobia: Secondary | ICD-10-CM | POA: Diagnosis not present

## 2021-04-11 DIAGNOSIS — F320 Major depressive disorder, single episode, mild: Secondary | ICD-10-CM

## 2021-04-11 DIAGNOSIS — K580 Irritable bowel syndrome with diarrhea: Secondary | ICD-10-CM

## 2021-04-11 MED ORDER — TRIAMCINOLONE ACETONIDE 0.1 % EX CREA: 1.0000 "application " | TOPICAL_CREAM | Freq: Two times a day (BID) | CUTANEOUS | 1 refills | Status: DC | PRN

## 2021-04-11 MED ORDER — BUPROPION HCL ER (XL) 150 MG PO TB24: ORAL_TABLET | ORAL | 3 refills | Status: DC

## 2021-04-11 MED ORDER — "SYRINGE 25G X 1"" 3 ML MISC": 1.0000 | 12 refills | Status: DC

## 2021-04-11 MED ORDER — CYANOCOBALAMIN 1000 MCG/ML IJ SOLN: 1000.0000 ug | INTRAMUSCULAR | 3 refills | Status: DC

## 2021-04-11 MED ORDER — SERTRALINE HCL 50 MG PO TABS: 50.0000 mg | ORAL_TABLET | Freq: Every day | ORAL | 1 refills | Status: DC

## 2021-04-11 MED ORDER — DIPHENOXYLATE-ATROPINE 2.5-0.025 MG PO TABS: 1.0000 | ORAL_TABLET | Freq: Four times a day (QID) | ORAL | 1 refills | Status: DC | PRN

## 2021-04-11 MED ORDER — CLONAZEPAM 0.5 MG PO TABS: 0.5000 mg | ORAL_TABLET | Freq: Two times a day (BID) | ORAL | 0 refills | Status: AC | PRN

## 2021-04-11 NOTE — Progress Notes (Signed)
Date:  04/11/2021   Name:  Brandi Dominguez   DOB:  1971/03/16   MRN:  [id number]   Chief Complaint: Depression  Depression        This is a chronic problem.  The problem has been gradually worsening since onset.  Associated symptoms include no fatigue, no headaches and no suicidal ideas.     The symptoms are aggravated by family issues (son's birthday and anniversary of his death).  Past treatments include SNRIs - Serotonin and norepinephrine reuptake inhibitors (Vybriid started in November; buspar discontinued).  Compliance with treatment is good.  Previous treatment provided mild relief.  Past medical history includes anxiety.   Anxiety Presents for follow-up visit. Symptoms include nervous/anxious behavior. Patient reports no chest pain, dizziness, shortness of breath or suicidal ideas. Symptoms occur most days. The severity of symptoms is causing significant distress. The quality of sleep is fair.   Compliance with medications is 76-100% (clonzepam PRN).    Lab Results  Component Value Date   CREATININE 0.88 10/25/2018   BUN 19 10/25/2018   NA 144 10/25/2018   K 4.7 10/25/2018   CL 104 10/25/2018   CO2 24 10/25/2018   No results found for: CHOL, HDL, LDLCALC, LDLDIRECT, TRIG, CHOLHDL Lab Results  Component Value Date   TSH 1.520 01/14/2018   Lab Results  Component Value Date   HGBA1C 6.0 (H) 10/25/2020   Lab Results  Component Value Date   WBC 9.3 03/19/2018   HGB 13.8 03/19/2018   HCT 42.7 03/19/2018   MCV 84.1 03/19/2018   PLT 275 03/19/2018   No results found for: ALT, AST, GGT, ALKPHOS, BILITOT   Review of Systems  Constitutional: Positive for unexpected weight change (losing with diet and exercise). Negative for chills, fatigue and fever.  HENT: Negative for trouble swallowing.   Respiratory: Negative for chest tightness, shortness of breath and wheezing.   Cardiovascular: Negative for chest pain.  Gastrointestinal: Negative for abdominal pain, diarrhea and  vomiting.  Skin: Positive for rash.  Neurological: Negative for dizziness, light-headedness and headaches.  Psychiatric/Behavioral: Positive for depression, dysphoric mood and sleep disturbance. Negative for suicidal ideas. The patient is nervous/anxious.     Patient Active Problem List   Diagnosis Date Noted  . Carotid artery aneurysm (HCC) 10/25/2020  . Tear of meniscus of right knee 05/30/2019  . Panic attacks 05/30/2019  . Major depressive disorder, recurrent episode, in partial remission (HCC) 10/25/2018  . Tobacco use disorder 10/25/2018  . Family history of premature CAD 10/25/2018  . Status post total thyroidectomy 03/25/2018  . Multiple thyroid nodules 01/18/2018  . Arthralgia of both hands 08/20/2017  . 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 Irritable bowel syndrome 09/01/2016  . Personal history of peptic ulcer disease 09/01/2016  . GERD (gastroesophageal reflux disease) 09/01/2016    Allergies  Allergen Reactions  . Diflucan [Fluconazole] Anaphylaxis  . Venlafaxine Anxiety and Palpitations    Past Surgical History:  Procedure Laterality Date  . CHOLECYSTECTOMY  2012  . THYROIDECTOMY N/A 03/25/2018   Procedure: THYROIDECTOMY;  Surgeon: Vernie Murders, MD;  Location: [facility] ORS;  Service: ENT;  Laterality: N/A;  . TONSILLECTOMY AND ADENOIDECTOMY      Social History   Tobacco Use  . Smoking status: Current Every Day Smoker    Packs/day: 0.50    Years: 15.00    Pack years: 7.50    Types: Cigarettes  . Smokeless tobacco: Never Used  Vaping Use  . Vaping Use: Never used  Substance  Use Topics  . Alcohol use: No  . Drug use: No     Medication list has been reviewed and updated.  Current Meds  Medication Sig  . acetaminophen (TYLENOL) 325 MG tablet Take 650 mg by mouth every 6 (six) hours as needed (for pain/headaches.).  Marland Kitchen ARMOUR THYROID 240 MG tablet Take 240 mg by mouth daily.  Marland Kitchen buPROPion (WELLBUTRIN XL) 150 MG 24 hr tablet TAKE 2 TABLETS DAILY (NEED  APPOINTMENT FOR FURTHER REFILLS)  . clonazePAM (KLONOPIN) 0.5 MG tablet Take 1 tablet (0.5 mg total) by mouth 2 (two) times daily as needed for anxiety.  . cyanocobalamin (,VITAMIN B-12,) 1000 MCG/ML injection Inject 1 mL (1,000 mcg total) into the muscle every 30 (thirty) days.  . diphenoxylate-atropine (LOMOTIL) 2.5-0.025 MG tablet Take 1 tablet by mouth 4 (four) times daily as needed for diarrhea or loose stools.  . hyoscyamine (LEVSIN, ANASPAZ) 0.125 MG tablet Take 1 tablet (0.125 mg total) by mouth every 4 (four) hours as needed (for abdominal pain/spasms.).  Marland Kitchen liothyronine (CYTOMEL) 5 MCG tablet Take 20 mcg by mouth daily.   . Syringe/Needle, Disp, (SYRINGE 3CC/25GX1") 25G X 1" 3 ML MISC 1 each by Does not apply route every 30 (thirty) days.  Marland Kitchen triamcinolone cream (KENALOG) 0.1 % Apply 1 application topically 2 (two) times daily as needed (for rash due to sun exposure. (polymorphic light eruption)).    PHQ 2/9 Scores 04/11/2021 10/25/2020 05/30/2019 10/25/2018  PHQ - 2 Score 2 6 5 2   PHQ- 9 Score 7 16 14 10     GAD 7 : Generalized Anxiety Score 04/11/2021 10/25/2020  Nervous, Anxious, on Edge 1 3  Control/stop worrying 1 3  Worry too much - different things 1 3  Trouble relaxing 1 0  Restless 0 0  Easily annoyed or irritable 0 2  Afraid - awful might happen 3 3  Total GAD 7 Score 7 14  Anxiety Difficulty - Not difficult at all    BP Readings from Last 3 Encounters:  04/11/21 112/76  10/25/20 104/60  05/30/19 128/74    Physical Exam Vitals and nursing note reviewed.  Constitutional:      General: She is not in acute distress.    Appearance: She is well-developed.  HENT:     Head: Normocephalic and atraumatic.  Cardiovascular:     Rate and Rhythm: Normal rate and regular rhythm.     Pulses: Normal pulses.     Heart sounds: No murmur heard.   Pulmonary:     Effort: Pulmonary effort is normal. No respiratory distress.     Breath sounds: No wheezing or rhonchi.   Musculoskeletal:     Cervical back: Normal range of motion.     Right lower leg: No edema.     Left lower leg: No edema.  Skin:    General: Skin is warm and dry.     Findings: No rash.  Neurological:     General: No focal deficit present.     Mental Status: She is alert and oriented to person, place, and time.  Psychiatric:        Mood and Affect: Mood normal.        Behavior: Behavior normal.     Wt Readings from Last 3 Encounters:  04/11/21 214 lb (97.1 kg)  10/25/20 220 lb (99.8 kg)  05/30/19 207 lb (93.9 kg)    BP 112/76   Pulse 91   Temp 98.2 F (36.8 C) (Oral)   Ht 5\' 3"  (1.6  m)   Wt 214 lb (97.1 kg)   SpO2 98%   BMI 37.91 kg/m   Assessment and Plan: 1. Panic attacks Refill clonazepam to use PRN - clonazePAM (KLONOPIN) 0.5 MG tablet; Take 1 tablet (0.5 mg total) by mouth 2 (two) times daily as needed for anxiety.  Dispense: 30 tablet; Refill: 0  2. Major depressive disorder, recurrent episode, in partial remission (HCC) Sx not controlled - did not tolerate Vybriid Continue Bupropion and add Zoloft Call if side effects - consider Paxil or Lexapro Follow up in 6 weeks - sertraline (ZOLOFT) 50 MG tablet; Take 1 tablet (50 mg total) by mouth daily.  Dispense: 30 tablet; Refill: 1 - buPROPion (WELLBUTRIN XL) 150 MG 24 hr tablet; TAKE 2 TABLETS DAILY  Dispense: 180 tablet; Refill: 3  3. B12 deficiency - cyanocobalamin (,VITAMIN B-12,) 1000 MCG/ML injection; Inject 1 mL (1,000 mcg total) into the muscle every 14 (fourteen) days.  Dispense: 3 mL; Refill: 3 - Syringe/Needle, Disp, (SYRINGE 3CC/25GX1") 25G X 1" 3 ML MISC; 1 each by Does not apply route every 14 (fourteen) days.  Dispense: 3 each; Refill: 12  4. Polymorphic light eruption - triamcinolone cream (KENALOG) 0.1 %; Apply 1 application topically 2 (two) times daily as needed (for rash due to sun exposure. (polymorphic light eruption)).  Dispense: 80 g; Refill: 1  6. Irritable bowel syndrome with  diarrhea Using lomotil PRN - diphenoxylate-atropine (LOMOTIL) 2.5-0.025 MG tablet; Take 1 tablet by mouth 4 (four) times daily as needed for diarrhea or loose stools.  Dispense: 120 tablet; Refill: 1   Partially dictated using Animal nutritionist. Any errors are unintentional.  Bari Edward, MD [facility]  04/11/2021

## 2021-04-22 LAB — HM MAMMOGRAPHY

## 2021-05-20 ENCOUNTER — Ambulatory Visit: Payer: BC Managed Care – PPO | Admitting: Internal Medicine

## 2021-05-20 ENCOUNTER — Other Ambulatory Visit: Payer: Self-pay

## 2021-05-20 ENCOUNTER — Encounter: Payer: Self-pay | Admitting: Internal Medicine

## 2021-05-20 VITALS — BP 108/76 | HR 103 | Temp 98.1°F | Ht 63.0 in | Wt 208.0 lb

## 2021-05-20 DIAGNOSIS — F3341 Major depressive disorder, recurrent, in partial remission: Secondary | ICD-10-CM

## 2021-05-20 DIAGNOSIS — E538 Deficiency of other specified B group vitamins: Secondary | ICD-10-CM | POA: Diagnosis not present

## 2021-05-20 DIAGNOSIS — K047 Periapical abscess without sinus: Secondary | ICD-10-CM

## 2021-05-20 MED ORDER — "SYRINGE 25G X 1"" 3 ML MISC": 1.0000 | 0 refills | Status: DC

## 2021-05-20 MED ORDER — CYANOCOBALAMIN 1000 MCG/ML IJ SOLN: 1000.0000 ug | INTRAMUSCULAR | 3 refills | Status: DC

## 2021-05-20 MED ORDER — SERTRALINE HCL 100 MG PO TABS: 100.0000 mg | ORAL_TABLET | Freq: Every day | ORAL | 3 refills | Status: DC

## 2021-05-20 MED ORDER — AMOXICILLIN-POT CLAVULANATE 875-125 MG PO TABS: 1.0000 | ORAL_TABLET | Freq: Two times a day (BID) | ORAL | 0 refills | Status: AC

## 2021-05-20 NOTE — Progress Notes (Signed)
Date:  05/20/2021   Name:  Brandi Dominguez   DOB:  1971-08-13   MRN:  [id number]   Chief Complaint: Depression (6 week follow up.)  Depression        This is a chronic problem.  The problem occurs daily.  The problem has been gradually improving (better earlier on but now feeling slightly worse) since onset.  Associated symptoms include no fatigue, no headaches and no suicidal ideas.  Past treatments include SSRIs - Selective serotonin reuptake inhibitors. Dental Pain  This is a new (broke a molar and can't get in to the dentist before her travels next week x 3 weeks) problem. The current episode started in the past 7 days. The problem occurs constantly. The pain is mild. Associated symptoms include thermal sensitivity. Pertinent negatives include no fever or oral bleeding. She has tried NSAIDs for the symptoms. The treatment provided mild relief.    Lab Results  Component Value Date   CREATININE 0.88 10/25/2018   BUN 19 10/25/2018   NA 144 10/25/2018   K 4.7 10/25/2018   CL 104 10/25/2018   CO2 24 10/25/2018   No results found for: CHOL, HDL, LDLCALC, LDLDIRECT, TRIG, CHOLHDL Lab Results  Component Value Date   TSH 1.520 01/14/2018   Lab Results  Component Value Date   HGBA1C 6.0 (H) 10/25/2020   Lab Results  Component Value Date   WBC 9.3 03/19/2018   HGB 13.8 03/19/2018   HCT 42.7 03/19/2018   MCV 84.1 03/19/2018   PLT 275 03/19/2018   No results found for: ALT, AST, GGT, ALKPHOS, BILITOT   Review of Systems  Constitutional: Positive for unexpected weight change (working on weight loss). Negative for chills, fatigue and fever.  HENT: Positive for dental problem (cracked a tooth this week). Negative for trouble swallowing.        Jaw clenching and neck muscle tension  Respiratory: Negative for cough, chest tightness and shortness of breath.   Cardiovascular: Negative for chest pain, palpitations and leg swelling.  Neurological: Negative for dizziness and headaches.   Psychiatric/Behavioral: Positive for depression, dysphoric mood and sleep disturbance. Negative for suicidal ideas. The patient is nervous/anxious.     Patient Active Problem List   Diagnosis Date Noted  . Carotid artery aneurysm (HCC) 10/25/2020  . Tear of meniscus of right knee 05/30/2019  . Panic attacks 05/30/2019  . Major depressive disorder, recurrent episode, in partial remission (HCC) 10/25/2018  . Tobacco use disorder 10/25/2018  . Family history of premature CAD 10/25/2018  . Status post total thyroidectomy 03/25/2018  . Multiple thyroid nodules 01/18/2018  . Arthralgia of both hands 08/20/2017  . 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 Irritable bowel syndrome 09/01/2016  . Personal history of peptic ulcer disease 09/01/2016  . GERD (gastroesophageal reflux disease) 09/01/2016    Allergies  Allergen Reactions  . Diflucan [Fluconazole] Anaphylaxis  . Venlafaxine Anxiety and Palpitations    Past Surgical History:  Procedure Laterality Date  . CHOLECYSTECTOMY  2012  . THYROIDECTOMY N/A 03/25/2018   Procedure: THYROIDECTOMY;  Surgeon: Vernie Murders, MD;  Location: [facility] ORS;  Service: ENT;  Laterality: N/A;  . TONSILLECTOMY AND ADENOIDECTOMY      Social History   Tobacco Use  . Smoking status: Current Every Day Smoker    Packs/day: 0.50    Years: 15.00    Pack years: 7.50    Types: Cigarettes  . Smokeless tobacco: Never Used  Vaping Use  . Vaping Use: Never used  Substance Use Topics  . Alcohol use: No  . Drug use: No     Medication list has been reviewed and updated.  Current Meds  Medication Sig  . acetaminophen (TYLENOL) 325 MG tablet Take 650 mg by mouth every 6 (six) hours as needed (for pain/headaches.).  Marland Kitchen ARMOUR THYROID 240 MG tablet Take 270 mg by mouth daily.  Marland Kitchen buPROPion (WELLBUTRIN XL) 150 MG 24 hr tablet TAKE 2 TABLETS DAILY  . clonazePAM (KLONOPIN) 0.5 MG tablet Take 1 tablet (0.5 mg total) by mouth 2 (two) times daily as needed for  anxiety.  . cyanocobalamin (,VITAMIN B-12,) 1000 MCG/ML injection Inject 1 mL (1,000 mcg total) into the muscle every 14 (fourteen) days.  . diphenoxylate-atropine (LOMOTIL) 2.5-0.025 MG tablet Take 1 tablet by mouth 4 (four) times daily as needed for diarrhea or loose stools.  . hyoscyamine (LEVSIN, ANASPAZ) 0.125 MG tablet Take 1 tablet (0.125 mg total) by mouth every 4 (four) hours as needed (for abdominal pain/spasms.).  Marland Kitchen sertraline (ZOLOFT) 50 MG tablet Take 1 tablet (50 mg total) by mouth daily.  . Syringe/Needle, Disp, (SYRINGE 3CC/25GX1") 25G X 1" 3 ML MISC 1 each by Does not apply route every 14 (fourteen) days.  Marland Kitchen triamcinolone cream (KENALOG) 0.1 % Apply 1 application topically 2 (two) times daily as needed (for rash due to sun exposure. (polymorphic light eruption)).    PHQ 2/9 Scores 05/20/2021 04/11/2021 10/25/2020 05/30/2019  PHQ - 2 Score 2 2 6 5   PHQ- 9 Score 8 7 16 14     GAD 7 : Generalized Anxiety Score 05/20/2021 04/11/2021 10/25/2020  Nervous, Anxious, on Edge 3 1 3   Control/stop worrying 3 1 3   Worry too much - different things 2 1 3   Trouble relaxing 1 1 0  Restless 0 0 0  Easily annoyed or irritable 0 0 2  Afraid - awful might happen 3 3 3   Total GAD 7 Score 12 7 14   Anxiety Difficulty Not difficult at all - Not difficult at all    BP Readings from Last 3 Encounters:  05/20/21 108/76  04/11/21 112/76  10/25/20 104/60    Physical Exam Vitals and nursing note reviewed.  Constitutional:      General: She is not in acute distress.    Appearance: She is well-developed.  HENT:     Head: Normocephalic and atraumatic.     Mouth/Throat:      Comments: Area of dental concern Neck:     Vascular: No carotid bruit.  Cardiovascular:     Rate and Rhythm: Normal rate and regular rhythm.     Pulses: Normal pulses.  Pulmonary:     Effort: Pulmonary effort is normal. No respiratory distress.     Breath sounds: No wheezing or rhonchi.  Musculoskeletal:     Cervical  back: Normal range of motion. No tenderness.     Right lower leg: No edema.     Left lower leg: No edema.  Lymphadenopathy:     Cervical: No cervical adenopathy.  Skin:    General: Skin is warm and dry.     Findings: No rash.  Neurological:     General: No focal deficit present.     Mental Status: She is alert and oriented to person, place, and time.  Psychiatric:        Mood and Affect: Mood normal.        Behavior: Behavior normal.     Wt Readings from Last 3 Encounters:  05/20/21 208 lb (  94.3 kg)  04/11/21 214 lb (97.1 kg)  10/25/20 220 lb (99.8 kg)    BP 108/76   Pulse (!) 103   Temp 98.1 F (36.7 C) (Oral)   Ht 5\' 3"  (1.6 m)   Wt 208 lb (94.3 kg)   SpO2 98%   BMI 36.85 kg/m   Assessment and Plan: 1. Major depressive disorder, recurrent episode, in partial remission (HCC) Did well initially on Zoloft 50 mg; will increase to 75 mg for one week then 100 mg per day - sertraline (ZOLOFT) 100 MG tablet; Take 1 tablet (100 mg total) by mouth daily.  Dispense: 30 tablet; Refill: 3  2. B12 deficiency - Syringe/Needle, Disp, (SYRINGE 3CC/25GX1") 25G X 1" 3 ML MISC; 1 each by Does not apply route every 14 (fourteen) days.  Dispense: 30 each; Refill: 0 - cyanocobalamin (,VITAMIN B-12,) 1000 MCG/ML injection; Inject 1 mL (1,000 mcg total) into the muscle every 14 (fourteen) days.  Dispense: 6 mL; Refill: 3  3. Dental abscess Will give Augmentin prophylactic ally until she can see her Dentist. - amoxicillin-clavulanate (AUGMENTIN) 875-125 MG tablet; Take 1 tablet by mouth 2 (two) times daily for 10 days.  Dispense: 20 tablet; Refill: 0   Partially dictated using . Any errors are unintentional.  Animal nutritionist, MD [facility]  05/20/2021

## 2021-07-22 ENCOUNTER — Other Ambulatory Visit: Payer: Self-pay

## 2021-07-22 DIAGNOSIS — F3341 Major depressive disorder, recurrent, in partial remission: Secondary | ICD-10-CM

## 2021-07-22 MED ORDER — SERTRALINE HCL 100 MG PO TABS: 100.0000 mg | ORAL_TABLET | Freq: Every day | ORAL | 2 refills | Status: DC

## 2021-07-25 ENCOUNTER — Other Ambulatory Visit: Payer: Self-pay | Admitting: Internal Medicine

## 2021-07-25 DIAGNOSIS — F3341 Major depressive disorder, recurrent, in partial remission: Secondary | ICD-10-CM

## 2021-07-25 MED ORDER — SERTRALINE HCL 100 MG PO TABS: 100.0000 mg | ORAL_TABLET | Freq: Every day | ORAL | 1 refills | Status: DC

## 2021-10-12 ENCOUNTER — Encounter: Payer: Self-pay | Admitting: Internal Medicine

## 2021-11-04 ENCOUNTER — Ambulatory Visit (INDEPENDENT_AMBULATORY_CARE_PROVIDER_SITE_OTHER): Payer: BC Managed Care – PPO

## 2021-11-04 ENCOUNTER — Other Ambulatory Visit: Payer: Self-pay

## 2021-11-04 DIAGNOSIS — Z23 Encounter for immunization: Secondary | ICD-10-CM

## 2021-12-13 ENCOUNTER — Ambulatory Visit: Payer: BC Managed Care – PPO | Admitting: Internal Medicine

## 2021-12-13 ENCOUNTER — Other Ambulatory Visit: Payer: Self-pay

## 2021-12-13 ENCOUNTER — Encounter: Payer: Self-pay | Admitting: Internal Medicine

## 2021-12-13 VITALS — BP 122/87 | HR 102 | Ht 63.0 in | Wt 208.4 lb

## 2021-12-13 DIAGNOSIS — I720 Aneurysm of carotid artery: Secondary | ICD-10-CM

## 2021-12-13 DIAGNOSIS — Z202 Contact with and (suspected) exposure to infections with a predominantly sexual mode of transmission: Secondary | ICD-10-CM

## 2021-12-13 DIAGNOSIS — N761 Subacute and chronic vaginitis: Secondary | ICD-10-CM | POA: Diagnosis not present

## 2021-12-13 DIAGNOSIS — R7303 Prediabetes: Secondary | ICD-10-CM | POA: Insufficient documentation

## 2021-12-13 DIAGNOSIS — E890 Postprocedural hypothyroidism: Secondary | ICD-10-CM | POA: Diagnosis not present

## 2021-12-13 DIAGNOSIS — F3341 Major depressive disorder, recurrent, in partial remission: Secondary | ICD-10-CM

## 2021-12-13 DIAGNOSIS — Z30011 Encounter for initial prescription of contraceptive pills: Secondary | ICD-10-CM

## 2021-12-13 LAB — POCT WET PREP WITH KOH
KOH Prep POC: NEGATIVE
Trichomonas, UA: NEGATIVE

## 2021-12-13 MED ORDER — NORETHINDRONE 0.35 MG PO TABS: 1.0000 | ORAL_TABLET | Freq: Every day | ORAL | 1 refills | Status: DC

## 2021-12-13 NOTE — Progress Notes (Signed)
Date:  12/13/2021   Name:  Brandi Dominguez   DOB:  16-Dec-1971   MRN:  [id number]   Chief Complaint: Hypothyroidism (Wants to discuss the mini pill.) and Exposure to STD  Thyroid Problem Presents for follow-up visit. Symptoms include menstrual problem (heavy irregular menses with severe cramping). Patient reports no constipation, diarrhea, fatigue or palpitations. The symptoms have been stable.  Depression        This is a chronic problem.The problem is unchanged.  Associated symptoms include no fatigue and no headaches.  Past treatments include SSRIs - Selective serotonin reuptake inhibitors and other medications (sertraline, bupropion, clonazepam).  Past medical history includes thyroid problem.   Vaginal Itching The patient's primary symptoms include vaginal discharge. The patient's pertinent negatives include no genital odor or pelvic pain. This is a new problem. Associated symptoms include frequency. Pertinent negatives include no abdominal pain, constipation, diarrhea or headaches.   Lab Results  Component Value Date   NA 144 10/25/2018   K 4.7 10/25/2018   CO2 24 10/25/2018   GLUCOSE 82 10/25/2018   BUN 19 10/25/2018   CREATININE 0.88 10/25/2018   CALCIUM 9.2 10/25/2018   GFRNONAA 80 10/25/2018   No results found for: CHOL, HDL, LDLCALC, LDLDIRECT, TRIG, CHOLHDL Lab Results  Component Value Date   TSH 1.520 01/14/2018   Lab Results  Component Value Date   HGBA1C 6.0 (H) 10/25/2020   Lab Results  Component Value Date   WBC 9.3 03/19/2018   HGB 13.8 03/19/2018   HCT 42.7 03/19/2018   MCV 84.1 03/19/2018   PLT 275 03/19/2018   No results found for: ALT, AST, GGT, ALKPHOS, BILITOT Lab Results  Component Value Date   VD25OH 36.0 10/25/2020     Review of Systems  Constitutional:  Negative for fatigue and unexpected weight change.  HENT:  Negative for nosebleeds.   Eyes:  Negative for visual disturbance.  Respiratory:  Negative for cough, chest tightness, shortness  of breath and wheezing.   Cardiovascular:  Negative for chest pain, palpitations and leg swelling.  Gastrointestinal:  Negative for abdominal pain, constipation and diarrhea.  Genitourinary:  Positive for frequency, menstrual problem (heavy irregular menses with severe cramping) and vaginal discharge. Negative for pelvic pain.  Neurological:  Negative for dizziness, weakness, light-headedness and headaches.  Psychiatric/Behavioral:  Positive for depression.    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   Irritable bowel syndrome 09/01/2016   Personal history of peptic ulcer disease 09/01/2016   GERD (gastroesophageal reflux disease) 09/01/2016    Allergies  Allergen Reactions   Diflucan [Fluconazole] Anaphylaxis   Venlafaxine Anxiety and Palpitations    Past Surgical History:  Procedure Laterality Date   CHOLECYSTECTOMY  2012   THYROIDECTOMY N/A 03/25/2018   Procedure: THYROIDECTOMY;  Surgeon: Vernie Murders, MD;  Location: [facility] ORS;  Service: ENT;  Laterality: N/A;   TONSILLECTOMY AND ADENOIDECTOMY      Social History   Tobacco Use   Smoking status: Every Day    Packs/day: 0.50    Years: 15.00    Pack years: 7.50  Types: Cigarettes   Smokeless tobacco: Never  Vaping Use   Vaping Use: Never used  Substance Use Topics   Alcohol use: No   Drug use: No     Medication list has been reviewed and updated.  Current Meds  Medication Sig   acetaminophen (TYLENOL) 325 MG tablet Take 650 mg by mouth every 6 (six) hours as needed (for  pain/headaches.).   ARMOUR THYROID 90 MG tablet Take by mouth.   buPROPion (WELLBUTRIN XL) 150 MG 24 hr tablet TAKE 2 TABLETS DAILY   clonazePAM (KLONOPIN) 0.5 MG tablet Take 1 tablet (0.5 mg total) by mouth 2 (two) times daily as needed for anxiety.   cyanocobalamin (,VITAMIN B-12,) 1000 MCG/ML injection Inject 1 mL (1,000 mcg total) into the muscle every 14 (fourteen) days.   diphenoxylate-atropine (LOMOTIL) 2.5-0.025 MG tablet Take 1 tablet by mouth 4 (four) times daily as needed for diarrhea or loose stools.   hyoscyamine (LEVSIN, ANASPAZ) 0.125 MG tablet Take 1 tablet (0.125 mg total) by mouth every 4 (four) hours as needed (for abdominal pain/spasms.).   sertraline (ZOLOFT) 100 MG tablet Take 1 tablet (100 mg total) by mouth daily.   Syringe/Needle, Disp, (SYRINGE 3CC/25GX1") 25G X 1" 3 ML MISC 1 each by Does not apply route every 14 (fourteen) days.   triamcinolone cream (KENALOG) 0.1 % Apply 1 application topically 2 (two) times daily as needed (for rash due to sun exposure. (polymorphic light eruption)).    PHQ 2/9 Scores 12/13/2021 05/20/2021 04/11/2021 10/25/2020  PHQ - 2 Score 0 2 2 6   PHQ- 9 Score 5 8 7 16     GAD 7 : Generalized Anxiety Score 12/13/2021 05/20/2021 04/11/2021 10/25/2020  Nervous, Anxious, on Edge 1 3 1 3   Control/stop worrying 2 3 1 3   Worry too much - different things 2 2 1 3   Trouble relaxing 1 1 1  0  Restless 1 0 0 0  Easily annoyed or irritable 0 0 0 2  Afraid - awful might happen 2 3 3 3   Total GAD 7 Score 9 12 7 14   Anxiety Difficulty Somewhat difficult Not difficult at all - Not difficult at all    BP Readings from Last 3 Encounters:  12/13/21 122/87  05/20/21 108/76  04/11/21 112/76    Physical Exam Vitals and nursing note reviewed.  Constitutional:      General: She is not in acute distress.    Appearance: She is well-developed.  HENT:     Head: Normocephalic and atraumatic.  Neck:     Vascular: No carotid bruit.  Cardiovascular:     Rate  and Rhythm: Normal rate and regular rhythm.     Pulses: Normal pulses.     Heart sounds: No murmur heard. Pulmonary:     Effort: Pulmonary effort is normal. No respiratory distress.     Breath sounds: No wheezing or rhonchi.  Musculoskeletal:     Cervical back: Normal range of motion. No tenderness.     Right lower leg: No edema.     Left lower leg: No edema.  Lymphadenopathy:     Cervical: No cervical adenopathy.  Skin:    General: Skin is warm and dry.     Capillary Refill: Capillary refill takes less than 2 seconds.     Findings: No rash.  Neurological:     Mental Status: She is alert and oriented to person, place, and time.  Psychiatric:        Mood and Affect: Mood normal.  Behavior: Behavior normal.    Wt Readings from Last 3 Encounters:  12/13/21 208 lb 6.4 oz (94.5 kg)  05/20/21 208 lb (94.3 kg)  04/11/21 214 lb (97.1 kg)    BP 122/87    Pulse (!) 102    Ht 5\' 3"  (1.6 m)    Wt 208 lb 6.4 oz (94.5 kg)    LMP 12/08/2021 (Exact Date)    SpO2 99%    BMI 36.92 kg/m   Assessment and Plan: 1. Postoperative hypothyroidism Continue Armour thyroid - adjust dose if needed - TSH + free T4  2. Major depressive disorder, recurrent episode, in partial remission (HCC) Clinically stable on current regimen with good control of symptoms, No SI or HI. Will continue current therapy.  3. Prediabetes Will check labs at [facility]  4. Possible exposure to STD - GC/Chlamydia Probe Amp([facility]) - HIV Antibody (routine testing w rflx)  5. Subacute vaginitis Wet prep negative Use Coconut oil topically for relief - POCT Wet Prep with KOH  6. Carotid artery aneurysm ([place]) Last evaluation in 2109 did not reveal an aneurysm.  7. Encounter for initial prescription of contraceptive pills Will try minipill since she is a smoker. May need to establish with new GYN if sx persist - norethindrone (ORTHO MICRONOR) 0.35 MG tablet; Take 1 tablet (0.35 mg total) by mouth daily.  Dispense: 74  tablet; Refill: 1   Partially dictated using Editor, commissioning. Any errors are unintentional.  Halina Maidens, MD [facility]  12/13/2021

## 2021-12-14 LAB — TSH+FREE T4
Free T4: 0.94 ng/dL (ref 0.82–1.77)
TSH: 1.45 u[IU]/mL (ref 0.450–4.500)

## 2021-12-14 LAB — HIV ANTIBODY (ROUTINE TESTING W REFLEX): HIV Screen 4th Generation wRfx: NONREACTIVE

## 2021-12-17 LAB — GC/CHLAMYDIA PROBE AMP
Chlamydia trachomatis, NAA: NEGATIVE
Neisseria Gonorrhoeae by PCR: NEGATIVE

## 2022-02-15 ENCOUNTER — Other Ambulatory Visit: Payer: Self-pay

## 2022-02-15 ENCOUNTER — Encounter: Payer: Self-pay | Admitting: Internal Medicine

## 2022-02-15 ENCOUNTER — Other Ambulatory Visit: Payer: Self-pay | Admitting: Internal Medicine

## 2022-02-15 DIAGNOSIS — Z30011 Encounter for initial prescription of contraceptive pills: Secondary | ICD-10-CM

## 2022-02-15 DIAGNOSIS — E890 Postprocedural hypothyroidism: Secondary | ICD-10-CM

## 2022-02-15 DIAGNOSIS — F3341 Major depressive disorder, recurrent, in partial remission: Secondary | ICD-10-CM

## 2022-02-15 MED ORDER — NORETHINDRONE 0.35 MG PO TABS: 1.0000 | ORAL_TABLET | Freq: Every day | ORAL | 1 refills | Status: DC

## 2022-02-15 MED ORDER — SERTRALINE HCL 100 MG PO TABS: 100.0000 mg | ORAL_TABLET | Freq: Every day | ORAL | 1 refills | Status: DC

## 2022-02-15 MED ORDER — ARMOUR THYROID 90 MG PO TABS: 360.0000 mg | ORAL_TABLET | Freq: Every day | ORAL | 1 refills | Status: DC

## 2022-02-15 NOTE — Telephone Encounter (Signed)
Please review thyroid medication. Birth control and zoloft has been sent in. Not on current med list presrcibed by you.  KP

## 2022-02-16 ENCOUNTER — Other Ambulatory Visit: Payer: Self-pay

## 2022-02-16 DIAGNOSIS — E890 Postprocedural hypothyroidism: Secondary | ICD-10-CM

## 2022-02-16 MED ORDER — ARMOUR THYROID 90 MG PO TABS: 360.0000 mg | ORAL_TABLET | Freq: Every day | ORAL | 1 refills | Status: DC

## 2022-04-13 ENCOUNTER — Ambulatory Visit: Payer: BC Managed Care – PPO | Admitting: Internal Medicine

## 2022-04-27 ENCOUNTER — Other Ambulatory Visit: Payer: Self-pay | Admitting: Internal Medicine

## 2022-04-27 DIAGNOSIS — Z30011 Encounter for initial prescription of contraceptive pills: Secondary | ICD-10-CM

## 2022-04-27 NOTE — Telephone Encounter (Signed)
Requested medications are due for refill today.  no ? ?Requested medications are on the active medications list.  yes ? ?Last refill. 02/15/2022 #74 1 refill ? ?Future visit scheduled.   no ? ?Notes to clinic.  Pt is requesting a 1 year supply. ? ? ? ?Requested Prescriptions  ?Pending Prescriptions Disp Refills  ? ERRIN 0.35 MG tablet [Pharmacy Med Name: Errin 0.35 MG Oral Tablet (Norethindrone)] 84 tablet 3  ?  Sig: TAKE 1 TABLET BY MOUTH DAILY  ?  ? OB/GYN: Contraceptives - Progestins Failed - 04/27/2022 12:21 PM  ?  ?  Failed - Patient is not a smoker  ?  ?  Passed - Last BP in normal range  ?  BP Readings from Last 1 Encounters:  ?12/13/21 122/87  ?  ?  ?  ?  Passed - Valid encounter within last 12 months  ?  Recent Outpatient Visits   ? ?      ? 4 months ago Postoperative hypothyroidism  ? [facility] Reubin Milan, MD  ? 11 months ago Major depressive disorder, recurrent episode, in partial remission (HCC)  ? [facility] Reubin Milan, MD  ? 1 year ago Panic attacks  ? [facility] Reubin Milan, MD  ? 1 year ago B12 deficiency  ? [facility] Reubin Milan, MD  ? 1 year ago Major depressive disorder, recurrent episode, in partial remission (HCC)  ? [facility] Reubin Milan, MD  ? ?  ?  ? ? ?  ?  ?  ?  ?

## 2022-04-28 NOTE — Telephone Encounter (Signed)
Patient will call back to set up appointment.  

## 2022-05-12 ENCOUNTER — Encounter: Payer: Self-pay | Admitting: Internal Medicine

## 2022-05-12 ENCOUNTER — Ambulatory Visit (INDEPENDENT_AMBULATORY_CARE_PROVIDER_SITE_OTHER): Payer: BC Managed Care – PPO | Admitting: Internal Medicine

## 2022-05-12 VITALS — BP 102/70 | HR 100 | Ht 63.0 in | Wt 208.0 lb

## 2022-05-12 DIAGNOSIS — R7303 Prediabetes: Secondary | ICD-10-CM | POA: Diagnosis not present

## 2022-05-12 DIAGNOSIS — Z1159 Encounter for screening for other viral diseases: Secondary | ICD-10-CM

## 2022-05-12 DIAGNOSIS — K580 Irritable bowel syndrome with diarrhea: Secondary | ICD-10-CM

## 2022-05-12 DIAGNOSIS — Z3041 Encounter for surveillance of contraceptive pills: Secondary | ICD-10-CM

## 2022-05-12 DIAGNOSIS — E890 Postprocedural hypothyroidism: Secondary | ICD-10-CM | POA: Diagnosis not present

## 2022-05-12 DIAGNOSIS — E538 Deficiency of other specified B group vitamins: Secondary | ICD-10-CM

## 2022-05-12 DIAGNOSIS — F3341 Major depressive disorder, recurrent, in partial remission: Secondary | ICD-10-CM

## 2022-05-12 DIAGNOSIS — L564 Polymorphous light eruption: Secondary | ICD-10-CM

## 2022-05-12 MED ORDER — NORETHINDRONE 0.35 MG PO TABS: 1.0000 | ORAL_TABLET | Freq: Every day | ORAL | 1 refills | Status: DC

## 2022-05-12 MED ORDER — HYOSCYAMINE SULFATE 0.125 MG PO TABS: 0.1250 mg | ORAL_TABLET | ORAL | 1 refills | Status: AC | PRN

## 2022-05-12 MED ORDER — DIPHENOXYLATE-ATROPINE 2.5-0.025 MG PO TABS: 1.0000 | ORAL_TABLET | Freq: Four times a day (QID) | ORAL | 1 refills | Status: DC | PRN

## 2022-05-12 MED ORDER — CYANOCOBALAMIN 1000 MCG/ML IJ SOLN: 1000.0000 ug | INTRAMUSCULAR | 3 refills | Status: DC

## 2022-05-12 MED ORDER — TRIAMCINOLONE ACETONIDE 0.1 % EX CREA: 1.0000 "application " | TOPICAL_CREAM | Freq: Two times a day (BID) | CUTANEOUS | 1 refills | Status: AC | PRN

## 2022-05-12 MED ORDER — BUPROPION HCL ER (XL) 150 MG PO TB24: ORAL_TABLET | ORAL | 3 refills | Status: DC

## 2022-05-12 NOTE — Progress Notes (Signed)
Date:  05/12/2022   Name:  Brandi Dominguez   DOB:  November 02, 1971   MRN:  [id number]   Chief Complaint: Depression and Hypothyroidism  Depression        This is a chronic problem.The problem is unchanged.  Associated symptoms include no fatigue, no headaches and no suicidal ideas.  Past treatments include other medications and SSRIs - Selective serotonin reuptake inhibitors (bupropion and clonazepam).  Past medical history includes thyroid problem.   Thyroid Problem Presents for follow-up visit. Symptoms include anxiety and diarrhea. Patient reports no constipation, fatigue, menstrual problem (sligthly irregular, also sweats) or palpitations. The symptoms have been stable.  Diarrhea  This is a recurrent (IBS-D) problem. The patient states that diarrhea does not awaken her from sleep. Pertinent negatives include no abdominal pain, chills, coughing or headaches. The symptoms are aggravated by stress. She has tried anti-motility drug (Levsin and Imodium) for the symptoms. Her past medical history is significant for irritable bowel syndrome.  Rash This is a recurrent problem. The rash is diffuse. Associated symptoms include diarrhea. Pertinent negatives include no cough, fatigue or shortness of breath. (Polymorphous light eruption) Past treatments include topical steroids. The treatment provided moderate relief.   Lab Results  Component Value Date   NA 144 10/25/2018   K 4.7 10/25/2018   CO2 24 10/25/2018   GLUCOSE 82 10/25/2018   BUN 19 10/25/2018   CREATININE 0.88 10/25/2018   CALCIUM 9.2 10/25/2018   GFRNONAA 80 10/25/2018   No results found for: CHOL, HDL, LDLCALC, LDLDIRECT, TRIG, CHOLHDL Lab Results  Component Value Date   TSH 1.450 12/13/2021   Lab Results  Component Value Date   HGBA1C 6.0 (H) 10/25/2020   Lab Results  Component Value Date   WBC 9.3 03/19/2018   HGB 13.8 03/19/2018   HCT 42.7 03/19/2018   MCV 84.1 03/19/2018   PLT 275 03/19/2018   No results found for: ALT,  AST, GGT, ALKPHOS, BILITOT Lab Results  Component Value Date   VD25OH 36.0 10/25/2020     Review of Systems  Constitutional:  Negative for chills, fatigue and unexpected weight change.  HENT:  Negative for nosebleeds and trouble swallowing.   Eyes:  Negative for visual disturbance.  Respiratory:  Negative for cough, chest tightness, shortness of breath and wheezing.   Cardiovascular:  Negative for chest pain, palpitations and leg swelling.  Gastrointestinal:  Positive for diarrhea. Negative for abdominal pain and constipation.  Genitourinary:  Negative for menstrual problem (sligthly irregular, also sweats).  Skin:  Positive for rash.  Neurological:  Negative for dizziness, weakness, light-headedness and headaches.  Psychiatric/Behavioral:  Positive for depression, dysphoric mood and sleep disturbance. Negative for suicidal ideas. The patient is nervous/anxious.    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   Irritable bowel syndrome 09/01/2016   Personal history of peptic ulcer disease 09/01/2016   GERD (gastroesophageal reflux disease) 09/01/2016    Allergies  Allergen Reactions   Diflucan [Fluconazole] Anaphylaxis   Venlafaxine Anxiety and Palpitations    Past Surgical History:  Procedure Laterality Date   CHOLECYSTECTOMY  2012   THYROIDECTOMY N/A  03/25/2018   Procedure: THYROIDECTOMY;  Surgeon: Vernie Murders, MD;  Location: [facility] ORS;  Service: ENT;  Laterality: N/A;   TONSILLECTOMY AND ADENOIDECTOMY      Social History   Tobacco Use   Smoking status: Every Day    Packs/day: 0.50     Years: 15.00    Pack years: 7.50    Types: Cigarettes   Smokeless tobacco: Never  Vaping Use   Vaping Use: Never used  Substance Use Topics   Alcohol use: No   Drug use: No     Medication list has been reviewed and updated.  Current Meds  Medication Sig   acetaminophen (TYLENOL) 325 MG tablet Take 650 mg by mouth every 6 (six) hours as needed (for pain/headaches.).   ARMOUR THYROID 90 MG tablet Take 4 tablets (360 mg total) by mouth daily.   buPROPion (WELLBUTRIN XL) 150 MG 24 hr tablet TAKE 2 TABLETS DAILY   clonazePAM (KLONOPIN) 0.5 MG tablet Take 1 tablet (0.5 mg total) by mouth 2 (two) times daily as needed for anxiety.   cyanocobalamin (,VITAMIN B-12,) 1000 MCG/ML injection Inject 1 mL (1,000 mcg total) into the muscle every 14 (fourteen) days.   ERRIN 0.35 MG tablet TAKE 1 TABLET BY MOUTH DAILY   sertraline (ZOLOFT) 100 MG tablet Take 1 tablet (100 mg total) by mouth daily.   [DISCONTINUED] diphenoxylate-atropine (LOMOTIL) 2.5-0.025 MG tablet Take 1 tablet by mouth 4 (four) times daily as needed for diarrhea or loose stools.   [DISCONTINUED] hyoscyamine (LEVSIN, ANASPAZ) 0.125 MG tablet Take 1 tablet (0.125 mg total) by mouth every 4 (four) hours as needed (for abdominal pain/spasms.).   [DISCONTINUED] Syringe/Needle, Disp, (SYRINGE 3CC/25GX1") 25G X 1" 3 ML MISC 1 each by Does not apply route every 14 (fourteen) days.   [DISCONTINUED] triamcinolone cream (KENALOG) 0.1 % Apply 1 application topically 2 (two) times daily as needed (for rash due to sun exposure. (polymorphic light eruption)).       05/12/2022    2:16 PM 12/13/2021    2:05 PM 05/20/2021    2:30 PM 04/11/2021    4:10 PM  GAD 7 : Generalized Anxiety Score  Nervous, Anxious, on Edge 2 1 3 1   Control/stop worrying 2 2 3 1   Worry too much - different things 3 2 2 1   Trouble relaxing 1 1 1 1   Restless 2 1 0 0  Easily annoyed or irritable 1 0 0 0  Afraid - awful might happen 3 2 3 3   Total GAD 7 Score 14 9 12  7   Anxiety Difficulty Somewhat difficult Somewhat difficult Not difficult at all        05/12/2022    2:15 PM  Depression screen PHQ 2/9  Decreased Interest 1  Down, Depressed, Hopeless 1  PHQ - 2 Score 2  Altered sleeping 1  Tired, decreased energy 1  Change in appetite 1  Feeling bad or failure about yourself  2  Trouble concentrating 3  Moving slowly or fidgety/restless 1  Suicidal thoughts 0  PHQ-9 Score 11  Difficult doing work/chores Somewhat difficult    BP Readings from Last 3 Encounters:  05/12/22 102/70  12/13/21 122/87  05/20/21 108/76    Physical Exam Vitals and nursing note reviewed.  Constitutional:      General: She is not in acute distress.    Appearance: Normal appearance. She is well-developed.  HENT:     Head: Normocephalic and atraumatic.  Cardiovascular:     Rate and Rhythm: Normal  rate and regular rhythm.     Pulses: Normal pulses.     Heart sounds: No murmur heard. Pulmonary:     Effort: Pulmonary effort is normal. No respiratory distress.     Breath sounds: No wheezing or rhonchi.  Musculoskeletal:     Cervical back: Normal range of motion. No spasms, tenderness or bony tenderness. No pain with movement. Normal range of motion.     Right lower leg: No edema.     Left lower leg: No edema.  Lymphadenopathy:     Cervical: No cervical adenopathy.  Skin:    General: Skin is warm and dry.     Findings: No rash.  Neurological:     General: No focal deficit present.     Mental Status: She is alert and oriented to person, place, and time.  Psychiatric:        Mood and Affect: Mood normal.        Behavior: Behavior normal.    Wt Readings from Last 3 Encounters:  05/12/22 208 lb (94.3 kg)  12/13/21 208 lb 6.4 oz (94.5 kg)  05/20/21 208 lb (94.3 kg)    BP 102/70   Pulse 100   Ht 5\' 3"  (1.6 m)   Wt 208 lb (94.3 kg)   SpO2 97%   BMI 36.85 kg/m   Assessment and Plan: 1. Major depressive disorder, recurrent episode, in partial remission  (HCC) Clinically stable on current regimen with good control of symptoms, No SI or HI. Will continue current therapy with Zoloft and Bupropion Uses clonazepam rarely - has 6 tabs from Rx one year ago - buPROPion (WELLBUTRIN XL) 150 MG 24 hr tablet; TAKE 2 TABLETS DAILY  Dispense: 180 tablet; Refill: 3  2. Oral contraceptive pill surveillance Continue progesterone only pills Not a candidate for Estrogen due to smoking Sweats are likely due to peri-menopausal state - norethindrone (ERRIN) 0.35 MG tablet; Take 1 tablet (0.35 mg total) by mouth daily.  Dispense: 84 tablet; Refill: 1  3. Prediabetes Continue efforts at exercise and low carb diet - Hemoglobin A1c - Comprehensive metabolic panel  4. Postoperative hypothyroidism supplemented - TSH + free T4  5. B12 deficiency Continue home injections every 2 weeks - cyanocobalamin (,VITAMIN B-12,) 1000 MCG/ML injection; Inject 1 mL (1,000 mcg total) into the muscle every 14 (fourteen) days.  Dispense: 6 mL; Refill: 3  6. Irritable bowel syndrome with diarrhea Symptoms are fairly well controlled with medication Discussed CRC screening with colonoscopy - she prefers other screening so will defer to annual exam and discuss further. - hyoscyamine (LEVSIN) 0.125 MG tablet; Take 1 tablet (0.125 mg total) by mouth every 4 (four) hours as needed (for abdominal pain/spasms.).  Dispense: 360 tablet; Refill: 1 - diphenoxylate-atropine (LOMOTIL) 2.5-0.025 MG tablet; Take 1 tablet by mouth 4 (four) times daily as needed for diarrhea or loose stools.  Dispense: 360 tablet; Refill: 1 - Comprehensive metabolic panel  7. Polymorphic light eruption - triamcinolone cream (KENALOG) 0.1 %; Apply 1 application. topically 2 (two) times daily as needed (for rash due to sun exposure. (polymorphic light eruption)).  Dispense: 80 g; Refill: 1  8. Need for hepatitis C screening test - Hepatitis C Antibody   Partially dictated using Animal nutritionistDragon software. Any errors are  unintentional.  Bari EdwardLaura Rodrecus Belsky, MD [facility]  05/12/2022

## 2022-05-15 ENCOUNTER — Other Ambulatory Visit: Payer: Self-pay

## 2022-05-15 MED ORDER — "SYRINGE 25G X 1"" 3 ML MISC": 1.0000 | 0 refills | Status: DC

## 2022-05-16 ENCOUNTER — Other Ambulatory Visit: Payer: Self-pay

## 2022-05-16 MED ORDER — "SYRINGE 25G X 1"" 3 ML MISC": 1.0000 | 0 refills | Status: AC

## 2022-06-22 LAB — COMPREHENSIVE METABOLIC PANEL
ALT: 10 IU/L (ref 0–32)
AST: 14 IU/L (ref 0–40)
Albumin/Globulin Ratio: 1.5 (ref 1.2–2.2)
Albumin: 4 g/dL (ref 3.8–4.8)
Alkaline Phosphatase: 73 IU/L (ref 44–121)
BUN/Creatinine Ratio: 21 (ref 9–23)
BUN: 17 mg/dL (ref 6–24)
Bilirubin Total: 0.2 mg/dL (ref 0.0–1.2)
CO2: 20 mmol/L (ref 20–29)
Calcium: 9 mg/dL (ref 8.7–10.2)
Chloride: 106 mmol/L (ref 96–106)
Creatinine, Ser: 0.8 mg/dL (ref 0.57–1.00)
Globulin, Total: 2.6 g/dL (ref 1.5–4.5)
Glucose: 88 mg/dL (ref 70–99)
Potassium: 4.4 mmol/L (ref 3.5–5.2)
Sodium: 142 mmol/L (ref 134–144)
Total Protein: 6.6 g/dL (ref 6.0–8.5)
eGFR: 91 mL/min/{1.73_m2} (ref >59)

## 2022-06-22 LAB — HEMOGLOBIN A1C
Est. average glucose Bld gHb Est-mCnc: 120 mg/dL
Hgb A1c MFr Bld: 5.8 % — ABNORMAL HIGH (ref 4.8–5.6)

## 2022-06-22 LAB — HEPATITIS C ANTIBODY: Hep C Virus Ab: NONREACTIVE

## 2022-06-22 LAB — TSH+FREE T4
Free T4: 1.43 ng/dL (ref 0.82–1.77)
TSH: 0.054 u[IU]/mL — ABNORMAL LOW (ref 0.450–4.500)

## 2022-07-13 ENCOUNTER — Other Ambulatory Visit: Payer: Self-pay | Admitting: Internal Medicine

## 2022-07-13 DIAGNOSIS — K580 Irritable bowel syndrome with diarrhea: Secondary | ICD-10-CM

## 2022-07-14 NOTE — Telephone Encounter (Signed)
Requested medications are due for refill today.  yes  Requested medications are on the active medications list.  yes  Last refill. 05/12/2022 #360 1 refill  Future visit scheduled.   no  Notes to clinic.  New medication to pt. Pharmacy requesting 1 year Rx.    Requested Prescriptions  Pending Prescriptions Disp Refills   hyoscyamine (LEVSIN) 0.125 MG tablet [Pharmacy Med Name: HYOSCYAMINE  0.125MG   TAB] 540 tablet 3    Sig: TAKE 1 TABLET BY MOUTH EVERY 4  HOURS AS NEEDED FOR ABDOMINAL  PAIN/SPASMS     Gastroenterology:  Antispasmodic Agents Passed - 07/13/2022  9:15 AM      Passed - Valid encounter within last 12 months    Recent Outpatient Visits           2 months ago Major depressive disorder, recurrent episode, in partial remission [facility])   [facility] Reubin Milan, MD   7 months ago Postoperative hypothyroidism   [facility] Reubin Milan, MD   1 year ago Major depressive disorder, recurrent episode, in partial remission [facility])   [facility] Reubin Milan, MD   1 year ago Panic attacks   [facility] Reubin Milan, MD   1 year ago B12 deficiency   [facility] Reubin Milan, MD

## 2022-08-11 ENCOUNTER — Other Ambulatory Visit: Payer: Self-pay | Admitting: Internal Medicine

## 2022-08-11 DIAGNOSIS — E890 Postprocedural hypothyroidism: Secondary | ICD-10-CM

## 2022-08-11 NOTE — Telephone Encounter (Signed)
Requested Prescriptions  Pending Prescriptions Disp Refills  . ARMOUR THYROID 90 MG tablet [Pharmacy Med Name: Armour Thyroid 90 MG Oral Tablet] 360 tablet 3    Sig: TAKE 4 TABLETS BY MOUTH DAILY     Endocrinology:  Hypothyroid Agents Failed - 08/11/2022  8:34 AM      Failed - TSH in normal range and within 360 days    TSH  Date Value Ref Range Status  06/21/2022 0.054 (L) 0.450 - 4.500 uIU/mL Final         Passed - Valid encounter within last 12 months    Recent Outpatient Visits          3 months ago Major depressive disorder, recurrent episode, in partial remission (HCC)   [place] [facility], Nyoka Cowden, MD   8 months ago Postoperative hypothyroidism   [place] [facility] at [facility], Nyoka Cowden, MD   1 year ago Major depressive disorder, recurrent episode, in partial remission [facility])   [place] [facility] at [facility], Nyoka Cowden, MD   1 year ago Panic attacks    Primary Care and Sports Medicine at [facility], Nyoka Cowden, MD   1 year ago B12 deficiency   [facility] at [facility], Nyoka Cowden, MD

## 2022-09-16 ENCOUNTER — Other Ambulatory Visit: Payer: Self-pay | Admitting: Internal Medicine

## 2022-09-16 DIAGNOSIS — Z3041 Encounter for surveillance of contraceptive pills: Secondary | ICD-10-CM

## 2022-09-18 NOTE — Telephone Encounter (Signed)
Requested Prescriptions  Pending Prescriptions Disp Refills  . ERRIN 0.35 MG tablet [Pharmacy Med Name: Errin 0.35 MG Oral Tablet (Norethindrone)] 84 tablet 3    Sig: TAKE 1 TABLET BY MOUTH DAILY     OB/GYN: Contraceptives - Progestins Failed - 09/16/2022  6:47 AM      Failed - Patient is not a smoker      Passed - Last BP in normal range    BP Readings from Last 1 Encounters:  05/12/22 102/70         Passed - Valid encounter within last 12 months    Recent Outpatient Visits          4 months ago Major depressive disorder, recurrent episode, in partial remission ([place])   [place] [facility] at [facility], Jesse Sans, MD   9 months ago Postoperative hypothyroidism   [place] [facility] at [facility], Jesse Sans, MD   1 year ago Major depressive disorder, recurrent episode, in partial remission [facility])   [place] [facility] at [facility], Jesse Sans, MD   1 year ago Panic attacks   [place] [facility] at [facility], Jesse Sans, MD   1 year ago B12 deficiency   [facility] at [facility], Jesse Sans, MD

## 2022-10-13 ENCOUNTER — Ambulatory Visit (INDEPENDENT_AMBULATORY_CARE_PROVIDER_SITE_OTHER): Payer: BC Managed Care – PPO

## 2022-10-13 DIAGNOSIS — Z23 Encounter for immunization: Secondary | ICD-10-CM

## 2023-01-04 ENCOUNTER — Encounter: Payer: Self-pay | Admitting: Internal Medicine

## 2023-01-05 ENCOUNTER — Other Ambulatory Visit: Payer: Self-pay | Admitting: Internal Medicine

## 2023-01-05 DIAGNOSIS — F3341 Major depressive disorder, recurrent, in partial remission: Secondary | ICD-10-CM

## 2023-01-05 DIAGNOSIS — E890 Postprocedural hypothyroidism: Secondary | ICD-10-CM

## 2023-01-05 DIAGNOSIS — Z3041 Encounter for surveillance of contraceptive pills: Secondary | ICD-10-CM

## 2023-01-05 MED ORDER — ARMOUR THYROID 90 MG PO TABS: 360.0000 mg | ORAL_TABLET | Freq: Every day | ORAL | 0 refills | Status: DC

## 2023-01-05 MED ORDER — SERTRALINE HCL 100 MG PO TABS: 100.0000 mg | ORAL_TABLET | Freq: Every day | ORAL | 0 refills | Status: DC

## 2023-01-05 MED ORDER — NORETHINDRONE 0.35 MG PO TABS: 1.0000 | ORAL_TABLET | Freq: Every day | ORAL | 0 refills | Status: DC

## 2023-02-20 ENCOUNTER — Ambulatory Visit (INDEPENDENT_AMBULATORY_CARE_PROVIDER_SITE_OTHER): Payer: BC Managed Care – PPO | Admitting: Internal Medicine

## 2023-02-20 ENCOUNTER — Encounter: Payer: Self-pay | Admitting: Internal Medicine

## 2023-02-20 VITALS — BP 124/64 | HR 100 | Ht 63.0 in | Wt 207.0 lb

## 2023-02-20 DIAGNOSIS — R7303 Prediabetes: Secondary | ICD-10-CM | POA: Diagnosis not present

## 2023-02-20 DIAGNOSIS — Z6836 Body mass index (BMI) 36.0-36.9, adult: Secondary | ICD-10-CM

## 2023-02-20 DIAGNOSIS — E890 Postprocedural hypothyroidism: Secondary | ICD-10-CM

## 2023-02-20 DIAGNOSIS — F3341 Major depressive disorder, recurrent, in partial remission: Secondary | ICD-10-CM

## 2023-02-20 DIAGNOSIS — E538 Deficiency of other specified B group vitamins: Secondary | ICD-10-CM | POA: Diagnosis not present

## 2023-02-20 DIAGNOSIS — K580 Irritable bowel syndrome with diarrhea: Secondary | ICD-10-CM

## 2023-02-20 MED ORDER — WEGOVY 0.25 MG/0.5ML ~~LOC~~ SOAJ: 0.2500 mg | SUBCUTANEOUS | 0 refills | Status: DC

## 2023-02-20 MED ORDER — CYANOCOBALAMIN 1000 MCG/ML IJ SOLN: 1000.0000 ug | INTRAMUSCULAR | 12 refills | Status: AC

## 2023-02-20 MED ORDER — DICYCLOMINE HCL 10 MG PO CAPS: 10.0000 mg | ORAL_CAPSULE | Freq: Three times a day (TID) | ORAL | 2 refills | Status: AC

## 2023-02-20 NOTE — Assessment & Plan Note (Signed)
Previously using imodium prn but tried son's bentyl and had better results Will send in Rx

## 2023-02-20 NOTE — Assessment & Plan Note (Signed)
Clinically stable on current regimen with fair control of symptoms, No SI or HI. No change in management at this time. Consider adding Rexulti

## 2023-02-20 NOTE — Assessment & Plan Note (Addendum)
Currently eating low carb but struggling to lose weight. Consider phentermine; not a candidate for Wegovy/ozempic but she has a coupon for a free month and wants to use it to just start weight loss.

## 2023-02-20 NOTE — Progress Notes (Signed)
Date:  02/20/2023   Name:  Brandi Dominguez   DOB:  August 21, 1971   MRN:  [id number]   Chief Complaint: Depression  Depression        This is a chronic problem.The problem is unchanged.  Associated symptoms include no fatigue and no headaches.  Treatments tried: bupropion.  Compliance with treatment is good.  Previous treatment provided moderate relief.   Lab Results  Component Value Date   NA 142 06/21/2022   K 4.4 06/21/2022   CO2 20 06/21/2022   GLUCOSE 88 06/21/2022   BUN 17 06/21/2022   CREATININE 0.80 06/21/2022   CALCIUM 9.0 06/21/2022   EGFR 91 06/21/2022   GFRNONAA 80 10/25/2018   No results found for: "CHOL", "HDL", "LDLCALC", "LDLDIRECT", "TRIG", "CHOLHDL" Lab Results  Component Value Date   TSH 0.054 (L) 06/21/2022   Lab Results  Component Value Date   HGBA1C 5.8 (H) 06/21/2022   Lab Results  Component Value Date   WBC 9.3 03/19/2018   HGB 13.8 03/19/2018   HCT 42.7 03/19/2018   MCV 84.1 03/19/2018   PLT 275 03/19/2018   Lab Results  Component Value Date   ALT 10 06/21/2022   AST 14 06/21/2022   ALKPHOS 73 06/21/2022   BILITOT 0.2 06/21/2022   Lab Results  Component Value Date   VD25OH 36.0 10/25/2020     Review of Systems  Constitutional:  Negative for fatigue and unexpected weight change.  HENT:  Negative for nosebleeds.   Eyes:  Negative for visual disturbance.  Respiratory:  Negative for cough, chest tightness, shortness of breath and wheezing.   Cardiovascular:  Negative for chest pain, palpitations and leg swelling.  Gastrointestinal:  Negative for abdominal pain, constipation and diarrhea.  Neurological:  Negative for dizziness, weakness, light-headedness and headaches.  Psychiatric/Behavioral:  Positive for depression, dysphoric mood and sleep disturbance. The patient is nervous/anxious.     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   Irritable bowel syndrome 09/01/2016   Personal history of peptic ulcer disease 09/01/2016   GERD (gastroesophageal reflux disease) 09/01/2016    Allergies  Allergen Reactions   Diflucan [Fluconazole] Anaphylaxis   Venlafaxine Anxiety and Palpitations    Past Surgical History:  Procedure Laterality Date   CHOLECYSTECTOMY  2012   THYROIDECTOMY N/A 03/25/2018   Procedure: THYROIDECTOMY;  Surgeon: Margaretha Sheffield, MD;  Location: [facility] ORS;  Service: ENT;  Laterality: N/A;   TONSILLECTOMY AND ADENOIDECTOMY      Social History   Tobacco Use   Smoking status: Every Day    Packs/day: 0.25    Years: 15.00    Total pack years: 3.75    Types: Cigarettes   Smokeless tobacco: Never   Tobacco comments:    5 ciggs daily  Vaping Use   Vaping Use: Never used  Substance Use Topics   Alcohol use: No   Drug use: No     Medication list has been reviewed and updated.  Current Meds  Medication Sig   acetaminophen (TYLENOL) 325 MG tablet Take 650 mg by mouth every 6 (six) hours as needed (for pain/headaches.).  ARMOUR THYROID 90 MG tablet Take 4 tablets (360 mg total) by mouth daily.   buPROPion (WELLBUTRIN XL) 150 MG 24 hr tablet TAKE 2 TABLETS DAILY (Patient taking differently: Take 150 mg by mouth every other day.)   clonazePAM (KLONOPIN) 0.5 MG tablet Take 1 tablet (0.5 mg total) by mouth 2 (two) times daily as needed for anxiety.   dicyclomine (BENTYL) 10 MG capsule Take 1 capsule (10 mg total) by mouth 4 (four) times daily -  before meals and at bedtime.   diphenoxylate-atropine (LOMOTIL) 2.5-0.025 MG tablet Take 1 tablet by mouth 4 (four) times daily as needed for diarrhea or loose stools.    hyoscyamine (LEVSIN) 0.125 MG tablet Take 1 tablet (0.125 mg total) by mouth every 4 (four) hours as needed (for abdominal pain/spasms.).   norethindrone (ERRIN) 0.35 MG tablet Take 1 tablet (0.35 mg total) by mouth daily.   Semaglutide-Weight Management (WEGOVY) 0.25 MG/0.5ML SOAJ Inject 0.25 mg into the skin once a week.   sertraline (ZOLOFT) 100 MG tablet Take 1 tablet (100 mg total) by mouth daily.   Syringe/Needle, Disp, (SYRINGE 3CC/25GX1") 25G X 1" 3 ML MISC 1 each by Does not apply route every 14 (fourteen) days.   triamcinolone cream (KENALOG) 0.1 % Apply 1 application. topically 2 (two) times daily as needed (for rash due to sun exposure. (polymorphic light eruption)).   [DISCONTINUED] cyanocobalamin (,VITAMIN B-12,) 1000 MCG/ML injection Inject 1 mL (1,000 mcg total) into the muscle every 14 (fourteen) days.       02/20/2023    1:27 PM 05/12/2022    2:16 PM 12/13/2021    2:05 PM 05/20/2021    2:30 PM  GAD 7 : Generalized Anxiety Score  Nervous, Anxious, on Edge '2 2 1 3  '$ Control/stop worrying '2 2 2 3  '$ Worry too much - different things '2 3 2 2  '$ Trouble relaxing '1 1 1 1  '$ Restless '1 2 1 '$ 0  Easily annoyed or irritable 0 1 0 0  Afraid - awful might happen '3 3 2 3  '$ Total GAD 7 Score '11 14 9 12  '$ Anxiety Difficulty Somewhat difficult Somewhat difficult Somewhat difficult Not difficult at all       02/20/2023    1:27 PM 05/12/2022    2:15 PM 12/13/2021    2:04 PM  Depression screen PHQ 2/9  Decreased Interest 1 1 0  Down, Depressed, Hopeless 1 1 0  PHQ - 2 Score 2 2 0  Altered sleeping 1 1 0  Tired, decreased energy 1 1 0  Change in appetite '1 1 1  '$ Feeling bad or failure about yourself  '1 2 2  '$ Trouble concentrating '2 3 1  '$ Moving slowly or fidgety/restless '1 1 1  '$ Suicidal thoughts 0 0 0  PHQ-9 Score '9 11 5  '$ Difficult doing work/chores Not difficult at all Somewhat difficult Somewhat difficult    BP Readings from Last 3 Encounters:  02/20/23 124/64  05/12/22 102/70   12/13/21 122/87    Physical Exam Vitals and nursing note reviewed.  Constitutional:      General: She is not in acute distress.    Appearance: She is well-developed.  HENT:     Head: Normocephalic and atraumatic.  Cardiovascular:     Rate and Rhythm: Normal rate and regular rhythm.  Pulmonary:     Effort: Pulmonary effort is normal. No respiratory distress.     Breath sounds: No wheezing or rhonchi.  Musculoskeletal:     Cervical back: Normal  range of motion.  Lymphadenopathy:     Cervical: No cervical adenopathy.  Skin:    General: Skin is warm and dry.     Findings: No rash.  Neurological:     Mental Status: She is alert and oriented to person, place, and time.  Psychiatric:        Mood and Affect: Mood normal.        Behavior: Behavior normal.     Wt Readings from Last 3 Encounters:  02/20/23 207 lb (93.9 kg)  05/12/22 208 lb (94.3 kg)  12/13/21 208 lb 6.4 oz (94.5 kg)    BP 124/64   Pulse 100   Ht '5\' 3"'$  (1.6 m)   Wt 207 lb (93.9 kg)   SpO2 98%   BMI 36.67 kg/m   Assessment and Plan: Problem List Items Addressed This Visit       Digestive   Irritable bowel syndrome (Chronic)    Previously using imodium prn but tried son's bentyl and had better results Will send in Rx      Relevant Medications   dicyclomine (BENTYL) 10 MG capsule   Other Relevant Orders   CBC with Differential/Platelet     Other   Major depressive disorder, recurrent episode, in partial remission (HCC) - Primary (Chronic)    Clinically stable on current regimen with fair control of symptoms, No SI or HI. No change in management at this time. Consider adding Rexulti       Prediabetes (Chronic)    Currently eating low carb but struggling to lose weight. Consider phentermine; not a candidate for Wegovy/ozempic but she has a coupon for a free month and wants to use it to just start weight loss.      Relevant Orders   Comprehensive metabolic panel   Hemoglobin A1c   Other  Visit Diagnoses     BMI 36.0-36.9,adult       pt wants to try [facility] - aware of lack of ongoing coverage and availability issues   Relevant Medications   Semaglutide-Weight Management (WEGOVY) 0.25 MG/0.5ML SOAJ   B12 deficiency       Relevant Medications   cyanocobalamin (VITAMIN B12) 1000 MCG/ML injection   Postoperative hypothyroidism       Relevant Orders   TSH + free T4        Partially dictated using Editor, commissioning. Any errors are unintentional.  Halina Maidens, MD [facility]  02/20/2023

## 2023-02-26 ENCOUNTER — Other Ambulatory Visit: Payer: Self-pay | Admitting: Internal Medicine

## 2023-02-26 DIAGNOSIS — F3341 Major depressive disorder, recurrent, in partial remission: Secondary | ICD-10-CM

## 2023-03-08 ENCOUNTER — Other Ambulatory Visit: Payer: Self-pay | Admitting: Internal Medicine

## 2023-03-08 ENCOUNTER — Encounter: Payer: Self-pay | Admitting: Internal Medicine

## 2023-03-08 ENCOUNTER — Encounter: Payer: BC Managed Care – PPO | Admitting: Internal Medicine

## 2023-03-08 DIAGNOSIS — Z6836 Body mass index (BMI) 36.0-36.9, adult: Secondary | ICD-10-CM

## 2023-03-08 MED ORDER — PHENTERMINE HCL 30 MG PO CAPS: 30.0000 mg | ORAL_CAPSULE | ORAL | 0 refills | Status: DC

## 2023-04-06 ENCOUNTER — Other Ambulatory Visit: Payer: Self-pay | Admitting: Internal Medicine

## 2023-04-06 DIAGNOSIS — Z6836 Body mass index (BMI) 36.0-36.9, adult: Secondary | ICD-10-CM

## 2023-04-06 MED ORDER — PHENTERMINE HCL 30 MG PO CAPS: 30.0000 mg | ORAL_CAPSULE | ORAL | 0 refills | Status: DC

## 2023-04-06 NOTE — Telephone Encounter (Signed)
Please review.  KP

## 2023-04-10 ENCOUNTER — Ambulatory Visit (INDEPENDENT_AMBULATORY_CARE_PROVIDER_SITE_OTHER): Payer: BC Managed Care – PPO | Admitting: Internal Medicine

## 2023-04-10 ENCOUNTER — Encounter: Payer: Self-pay | Admitting: Internal Medicine

## 2023-04-10 VITALS — BP 122/60 | HR 110 | Ht 63.0 in | Wt 197.8 lb

## 2023-04-10 DIAGNOSIS — E669 Obesity, unspecified: Secondary | ICD-10-CM

## 2023-04-10 DIAGNOSIS — Z6835 Body mass index (BMI) 35.0-35.9, adult: Secondary | ICD-10-CM | POA: Diagnosis not present

## 2023-04-10 NOTE — Progress Notes (Signed)
Date:  04/10/2023   Name:  Brandi Dominguez   DOB:  [birth year]   MRN:  [id number]   Chief Complaint: Weight Check  HPI Weight management - pt is here for weight management follow up.  Started on Phentermine on 2/24.  Doing well without side effects.  Starting weight 207 lbs.   Today's weight 197 lbs.  Current diet is low carb and current exercise routine is walking.  Lab Results  Component Value Date   NA 142 06/21/2022   K 4.4 06/21/2022   CO2 20 06/21/2022   GLUCOSE 88 06/21/2022   BUN 17 06/21/2022   CREATININE 0.80 06/21/2022   CALCIUM 9.0 06/21/2022   EGFR 91 06/21/2022   GFRNONAA 80 10/25/2018   No results found for: "CHOL", "HDL", "LDLCALC", "LDLDIRECT", "TRIG", "CHOLHDL" Lab Results  Component Value Date   TSH 0.054 (L) 06/21/2022   Lab Results  Component Value Date   HGBA1C 5.8 (H) 06/21/2022   Lab Results  Component Value Date   WBC 9.3 03/19/2018   HGB 13.8 03/19/2018   HCT 42.7 03/19/2018   MCV 84.1 03/19/2018   PLT 275 03/19/2018   Lab Results  Component Value Date   ALT 10 06/21/2022   AST 14 06/21/2022   ALKPHOS 73 06/21/2022   BILITOT 0.2 06/21/2022   Lab Results  Component Value Date   VD25OH 36.0 10/25/2020     Review of Systems  Constitutional:  Negative for fatigue and unexpected weight change.  HENT:  Negative for nosebleeds.   Eyes:  Negative for visual disturbance.  Respiratory:  Negative for cough, chest tightness, shortness of breath and wheezing.   Cardiovascular:  Negative for chest pain, palpitations and leg swelling.  Gastrointestinal:  Negative for abdominal pain, constipation and diarrhea.  Neurological:  Negative for dizziness, weakness, light-headedness and headaches.  Psychiatric/Behavioral:  The patient is nervous/anxious (still seeing therapist regularly).     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   Irritable bowel syndrome 09/01/2016   Personal history of peptic ulcer disease 09/01/2016   GERD (gastroesophageal reflux disease) 09/01/2016    Allergies  Allergen Reactions   Diflucan [Fluconazole] Anaphylaxis   Venlafaxine Anxiety and Palpitations    Past Surgical History:  Procedure Laterality Date   CHOLECYSTECTOMY  2012   THYROIDECTOMY N/A 03/25/2018   Procedure: THYROIDECTOMY;  Surgeon: Vernie Murders, MD;  Location: [facility] ORS;  Service: ENT;  Laterality: N/A;   TONSILLECTOMY AND ADENOIDECTOMY      Social History   Tobacco Use   Smoking status: Every Day    Packs/day: 0.25    Years: 15.00    Additional pack years: 0.00    Total pack years: 3.75    Types: Cigarettes   Smokeless tobacco: Never   Tobacco comments:    5 ciggs daily  Vaping Use   Vaping Use: Never used  Substance Use Topics   Alcohol use: No   Drug use: No     Medication list has been reviewed and updated.  Current Meds  Medication Sig   acetaminophen (TYLENOL) 325 MG tablet  Take 650 mg by mouth every 6 (six) hours as needed (for pain/headaches.).   ARMOUR THYROID 90 MG tablet Take 4 tablets (360 mg total) by mouth daily.   buPROPion (WELLBUTRIN XL) 150 MG 24 hr tablet TAKE 2 TABLETS DAILY (Patient taking differently: Take 150 mg by mouth every other day.)   clonazePAM (KLONOPIN) 0.5 MG tablet Take 1 tablet (0.5 mg total) by mouth 2 (two) times daily as needed for anxiety.   cyanocobalamin (VITAMIN B12) 1000 MCG/ML injection Inject 1 mL (1,000 mcg total) into the muscle every 14 (fourteen) days.   dicyclomine (BENTYL) 10 MG capsule Take 1 capsule (10 mg total) by mouth 4 (four) times daily -   before meals and at bedtime.   diphenoxylate-atropine (LOMOTIL) 2.5-0.025 MG tablet Take 1 tablet by mouth 4 (four) times daily as needed for diarrhea or loose stools.   hyoscyamine (LEVSIN) 0.125 MG tablet Take 1 tablet (0.125 mg total) by mouth every 4 (four) hours as needed (for abdominal pain/spasms.).   norethindrone (ERRIN) 0.35 MG tablet Take 1 tablet (0.35 mg total) by mouth daily.   phentermine 30 MG capsule Take 1 capsule (30 mg total) by mouth every morning.   sertraline (ZOLOFT) 100 MG tablet TAKE 1 TABLET BY MOUTH DAILY   Syringe/Needle, Disp, (SYRINGE 3CC/25GX1") 25G X 1" 3 ML MISC 1 each by Does not apply route every 14 (fourteen) days.   triamcinolone cream (KENALOG) 0.1 % Apply 1 application. topically 2 (two) times daily as needed (for rash due to sun exposure. (polymorphic light eruption)).       02/20/2023    1:27 PM 05/12/2022    2:16 PM 12/13/2021    2:05 PM 05/20/2021    2:30 PM  GAD 7 : Generalized Anxiety Score  Nervous, Anxious, on Edge Control/stop worrying Worry too much - different things Trouble relaxing Restless 0  Easily annoyed or irritable 0 1 0 0  Afraid - awful might happen Total GAD 7 Score Anxiety Difficulty Somewhat difficult Somewhat difficult Somewhat difficult Not difficult at all       02/20/2023    1:27 PM 05/12/2022    2:15 PM 12/13/2021    2:04 PM  Depression screen PHQ 2/9  Decreased Interest 1 1 0  Down, Depressed, Hopeless 1 1 0  PHQ - 2 Score 2 2 0  Altered sleeping 1 1 0  Tired, decreased energy 1 1 0  Change in appetite Feeling bad or failure about yourself  Trouble concentrating Moving slowly or fidgety/restless Suicidal thoughts 0 0 0  PHQ-9 Score Difficult doing work/chores Not difficult at all Somewhat difficult Somewhat difficult    BP Readings from Last 3 Encounters:  04/10/23 122/60  02/20/23 124/64  05/12/22 102/70     Physical Exam Vitals and nursing note reviewed.  Constitutional:      General: She is not in acute distress.    Appearance: She is well-developed.  HENT:     Head: Normocephalic and atraumatic.  Cardiovascular:     Rate and Rhythm: Normal rate and regular rhythm.  Pulmonary:     Effort: Pulmonary effort is normal. No respiratory distress.     Breath sounds: No wheezing or  rhonchi.  Musculoskeletal:     Cervical back: Normal range of motion.  Lymphadenopathy:     Cervical: No cervical adenopathy.  Skin:    General: Skin is warm and dry.     Findings: No rash.  Neurological:     General: No focal deficit present.     Mental Status: She is alert and oriented to person, place, and time.  Psychiatric:        Mood and Affect: Mood normal.        Behavior: Behavior normal.     Wt Readings from Last 3 Encounters:  04/10/23 197 lb 12.8 oz (89.7 kg)  02/20/23 207 lb (93.9 kg)  05/12/22 208 lb (94.3 kg)    BP 122/60   Pulse (!) 110   Ht  (1.6 m)   Wt 197 lb 12.8 oz (89.7 kg)   SpO2 98%   BMI 35.04 kg/m   Assessment and Plan:  Problem List Items Addressed This Visit       Other   BMI 35.0-35.9,adult - Primary    Wegovy was not approved so patient has started Phentermine 2 months ago Weight loss so far is 10 lbs.  Minimal side effects. BP today is normal.  She has labs pending. Continue current regimen and follow up in 2 months.       Return in about 2 months (around 06/10/2023) for weight.   Partially dictated using Dragon software, any errors are not intentional.  Reubin Milan, MD [facility] Primary Care and Sports Medicine [place], [place]

## 2023-04-10 NOTE — Assessment & Plan Note (Addendum)
Brandi Dominguez was not approved so patient has started Phentermine 2 months ago Weight loss so far is 10 lbs.  Minimal side effects. BP today is normal.  She has labs pending. Continue current regimen and follow up in 2 months.

## 2023-04-11 LAB — COMPREHENSIVE METABOLIC PANEL
ALT: 11 IU/L (ref 0–32)
AST: 15 IU/L (ref 0–40)
Albumin/Globulin Ratio: 1.5 (ref 1.2–2.2)
Albumin: 3.9 g/dL (ref 3.9–4.9)
Alkaline Phosphatase: 85 IU/L (ref 44–121)
BUN/Creatinine Ratio: 18 (ref 9–23)
BUN: 18 mg/dL (ref 6–24)
Bilirubin Total: <0.2 mg/dL (ref 0.0–1.2)
CO2: 24 mmol/L (ref 20–29)
Calcium: 9.1 mg/dL (ref 8.7–10.2)
Chloride: 106 mmol/L (ref 96–106)
Creatinine, Ser: 0.98 mg/dL (ref 0.57–1.00)
Globulin, Total: 2.6 g/dL (ref 1.5–4.5)
Glucose: 82 mg/dL (ref 70–99)
Potassium: 4.8 mmol/L (ref 3.5–5.2)
Sodium: 142 mmol/L (ref 134–144)
Total Protein: 6.5 g/dL (ref 6.0–8.5)
eGFR: 71 mL/min/{1.73_m2} (ref >59)

## 2023-04-11 LAB — CBC WITH DIFFERENTIAL/PLATELET
Basophils Absolute: 0.1 10*3/uL (ref 0.0–0.2)
Basos: 1 %
EOS (ABSOLUTE): 0.1 10*3/uL (ref 0.0–0.4)
Eos: 1 %
Hematocrit: 41.1 % (ref 34.0–46.6)
Hemoglobin: 13.3 g/dL (ref 11.1–15.9)
Immature Grans (Abs): 0 10*3/uL (ref 0.0–0.1)
Immature Granulocytes: 0 %
Lymphocytes Absolute: 3.2 10*3/uL — ABNORMAL HIGH (ref 0.7–3.1)
Lymphs: 30 %
MCH: 27 pg (ref 26.6–33.0)
MCHC: 32.4 g/dL (ref 31.5–35.7)
MCV: 84 fL (ref 79–97)
Monocytes Absolute: 0.5 10*3/uL (ref 0.1–0.9)
Monocytes: 5 %
Neutrophils Absolute: 6.6 10*3/uL (ref 1.4–7.0)
Neutrophils: 63 %
Platelets: 322 10*3/uL (ref 150–450)
RBC: 4.92 x10E6/uL (ref 3.77–5.28)
RDW: 14.4 % (ref 11.7–15.4)
WBC: 10.5 10*3/uL (ref 3.4–10.8)

## 2023-04-11 LAB — TSH+FREE T4
Free T4: 1.42 ng/dL (ref 0.82–1.77)
TSH: 0.012 u[IU]/mL — ABNORMAL LOW (ref 0.450–4.500)

## 2023-04-11 LAB — HEMOGLOBIN A1C
Est. average glucose Bld gHb Est-mCnc: 117 mg/dL
Hgb A1c MFr Bld: 5.7 % — ABNORMAL HIGH (ref 4.8–5.6)

## 2023-04-26 NOTE — Telephone Encounter (Signed)
Please review.  KP

## 2023-05-28 ENCOUNTER — Encounter: Payer: Self-pay | Admitting: Internal Medicine

## 2023-05-28 ENCOUNTER — Ambulatory Visit (INDEPENDENT_AMBULATORY_CARE_PROVIDER_SITE_OTHER): Payer: BC Managed Care – PPO | Admitting: Internal Medicine

## 2023-05-28 ENCOUNTER — Ambulatory Visit: Payer: BC Managed Care – PPO | Admitting: Internal Medicine

## 2023-05-28 VITALS — BP 120/68 | HR 100 | Ht 63.0 in | Wt 199.0 lb

## 2023-05-28 DIAGNOSIS — F3341 Major depressive disorder, recurrent, in partial remission: Secondary | ICD-10-CM

## 2023-05-28 DIAGNOSIS — Z6835 Body mass index (BMI) 35.0-35.9, adult: Secondary | ICD-10-CM

## 2023-05-28 MED ORDER — PHENTERMINE HCL 30 MG PO CAPS: 30.0000 mg | ORAL_CAPSULE | ORAL | 0 refills | Status: DC

## 2023-05-28 NOTE — Assessment & Plan Note (Addendum)
On Phentermine since February. No side effects noted.  Taking Sertraline so need to limit course to 6 mo total. Weight loss to date is 10 lbs Will try bid phentermine for one month - if no significant benefit, will need to discontinue

## 2023-05-28 NOTE — Progress Notes (Signed)
Date:  05/28/2023   Name:  Brandi Dominguez   DOB:  27-Dec-1970   MRN:  [id number]   Chief Complaint: Weight Check (Pt has been out of phentermine for 1 week.)  Depression        This is a chronic problem.The problem is unchanged.  Associated symptoms include no fatigue and no headaches.  Past treatments include SSRIs - Selective serotonin reuptake inhibitors (sertraline and bupropion).  Weight management - pt is here for weight management follow up.  Started on Phentermine on 2/24.  Doing well without side effects.  Starting weight 207  lbs.   Today's weight 199 lbs.  Current diet is low carbohydrates  and current exercise routine is walking..  Lab Results  Component Value Date   NA 142 04/10/2023   K 4.8 04/10/2023   CO2 24 04/10/2023   GLUCOSE 82 04/10/2023   BUN 18 04/10/2023   CREATININE 0.98 04/10/2023   CALCIUM 9.1 04/10/2023   EGFR 71 04/10/2023   GFRNONAA 80 10/25/2018   No results found for: "CHOL", "HDL", "LDLCALC", "LDLDIRECT", "TRIG", "CHOLHDL" Lab Results  Component Value Date   TSH 0.012 (L) 04/10/2023   Lab Results  Component Value Date   HGBA1C 5.7 (H) 04/10/2023   Lab Results  Component Value Date   WBC 10.5 04/10/2023   HGB 13.3 04/10/2023   HCT 41.1 04/10/2023   MCV 84 04/10/2023   PLT 322 04/10/2023   Lab Results  Component Value Date   ALT 11 04/10/2023   AST 15 04/10/2023   ALKPHOS 85 04/10/2023   BILITOT <0.2 04/10/2023   Lab Results  Component Value Date   VD25OH 36.0 10/25/2020     Review of Systems  Constitutional:  Negative for fatigue and unexpected weight change.  HENT:  Negative for nosebleeds.   Eyes:  Negative for visual disturbance.  Respiratory:  Negative for cough, chest tightness, shortness of breath and wheezing.   Cardiovascular:  Negative for chest pain, palpitations and leg swelling.  Gastrointestinal:  Negative for abdominal pain, constipation and diarrhea.  Neurological:  Negative for dizziness, weakness,  light-headedness and headaches.  Psychiatric/Behavioral:  Positive for depression.     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   Irritable bowel syndrome 09/01/2016   Personal history of peptic ulcer disease 09/01/2016   GERD (gastroesophageal reflux disease) 09/01/2016    Allergies  Allergen Reactions   Diflucan [Fluconazole] Anaphylaxis   Venlafaxine Anxiety and Palpitations    Past Surgical History:  Procedure Laterality Date   CHOLECYSTECTOMY  2012   THYROIDECTOMY N/A 03/25/2018   Procedure: THYROIDECTOMY;  Surgeon: Vernie Murders, MD;  Location: [facility] ORS;  Service: ENT;  Laterality: N/A;   TONSILLECTOMY AND ADENOIDECTOMY      Social History   Tobacco Use   Smoking status: Every Day    Packs/day: 0.25    Years: 15.00    Additional pack years: 0.00    Total pack years: 3.75    Types: Cigarettes   Smokeless tobacco: Never   Tobacco comments:    5 ciggs daily  Vaping  Use   Vaping Use: Never used  Substance Use Topics   Alcohol use: No   Drug use: No     Medication list has been reviewed and updated.  Current Meds  Medication Sig   acetaminophen (TYLENOL) 325 MG tablet Take 650 mg by mouth every 6 (six) hours as needed (for pain/headaches.).   ARMOUR THYROID 90 MG tablet Take 4 tablets (360 mg total) by mouth daily.   buPROPion (WELLBUTRIN XL) 150 MG 24 hr tablet TAKE 2 TABLETS DAILY (Patient taking differently: Take 150 mg by mouth every other day.)   clonazePAM (KLONOPIN) 0.5 MG tablet Take 1 tablet (0.5 mg total) by mouth 2 (two) times  daily as needed for anxiety.   cyanocobalamin (VITAMIN B12) 1000 MCG/ML injection Inject 1 mL (1,000 mcg total) into the muscle every 14 (fourteen) days.   dicyclomine (BENTYL) 10 MG capsule Take 1 capsule (10 mg total) by mouth 4 (four) times daily -  before meals and at bedtime.   diphenoxylate-atropine (LOMOTIL) 2.5-0.025 MG tablet Take 1 tablet by mouth 4 (four) times daily as needed for diarrhea or loose stools.   hyoscyamine (LEVSIN) 0.125 MG tablet Take 1 tablet (0.125 mg total) by mouth every 4 (four) hours as needed (for abdominal pain/spasms.).   norethindrone (ERRIN) 0.35 MG tablet Take 1 tablet (0.35 mg total) by mouth daily.   sertraline (ZOLOFT) 100 MG tablet TAKE 1 TABLET BY MOUTH DAILY   Syringe/Needle, Disp, (SYRINGE 3CC/25GX1") 25G X 1" 3 ML MISC 1 each by Does not apply route every 14 (fourteen) days.   [DISCONTINUED] phentermine 30 MG capsule Take 1 capsule (30 mg total) by mouth every morning.       05/28/2023    1:57 PM 02/20/2023    1:27 PM 05/12/2022    2:16 PM 12/13/2021    2:05 PM  GAD 7 : Generalized Anxiety Score  Nervous, Anxious, on Edge 2 2 2 1   Control/stop worrying 2 2 2 2   Worry too much - different things 2 2 3 2   Trouble relaxing 1 1 1 1   Restless 2 1 2 1   Easily annoyed or irritable 0 0 1 0  Afraid - awful might happen 2 3 3 2   Total GAD 7 Score 11 11 14 9   Anxiety Difficulty Somewhat difficult Somewhat difficult Somewhat difficult Somewhat difficult       05/28/2023    1:56 PM 02/20/2023    1:27 PM 05/12/2022    2:15 PM  Depression screen PHQ 2/9  Decreased Interest 1 1 1   Down, Depressed, Hopeless 1 1 1   PHQ - 2 Score 2 2 2   Altered sleeping 1 1 1   Tired, decreased energy 2 1 1   Change in appetite 2 1 1   Feeling bad or failure about yourself  2 1 2   Trouble concentrating 3 2 3   Moving slowly or fidgety/restless 2 1 1   Suicidal thoughts 0 0 0  PHQ-9 Score 14 9 11   Difficult doing work/chores Somewhat difficult Not difficult at all Somewhat  difficult    BP Readings from Last 3 Encounters:  05/28/23 120/68  04/10/23 122/60  02/20/23 124/64    Physical Exam Vitals and nursing note reviewed.  Constitutional:      General: She is not in acute distress.    Appearance: She is well-developed.  HENT:     Head: Normocephalic and atraumatic.  Cardiovascular:     Rate and Rhythm: Normal rate and regular rhythm.  Pulmonary:  Effort: Pulmonary effort is normal. No respiratory distress.     Breath sounds: No wheezing or rhonchi.  Musculoskeletal:     Cervical back: Normal range of motion.     Right lower leg: No edema.     Left lower leg: No edema.  Lymphadenopathy:     Cervical: No cervical adenopathy.  Skin:    General: Skin is warm and dry.     Capillary Refill: Capillary refill takes less than 2 seconds.     Findings: No rash.  Neurological:     Mental Status: She is alert and oriented to person, place, and time.  Psychiatric:        Mood and Affect: Mood normal.        Behavior: Behavior normal.     Wt Readings from Last 3 Encounters:  05/28/23 199 lb (90.3 kg)  04/10/23 197 lb 12.8 oz (89.7 kg)  02/20/23 207 lb (93.9 kg)    BP 120/68   Pulse 100   Ht 5\' 3"  (1.6 m)   Wt 199 lb (90.3 kg)   SpO2 97%   BMI 35.25 kg/m   Assessment and Plan:  Problem List Items Addressed This Visit     Major depressive disorder, recurrent episode, in partial remission (HCC) - Primary (Chronic)    Clinically stable on current regimen with good control of symptoms, No SI or HI. No change in management at this time. On Sertraline 100 mg and bupropion every other day.      BMI 35.0-35.9,adult    On Phentermine since February. No side effects noted.  Taking Sertraline so need to limit course to 6 mo total. Weight loss to date is 10 lbs Will try bid phentermine for one month - if no significant benefit, will need to discontinue       Relevant Medications   phentermine 30 MG capsule    Return in about 1 month  (around 06/27/2023) for weight follow up.   Partially dictated using Dragon software, any errors are not intentional.  Reubin Milan, MD [facility] Primary Care and Sports Medicine [place], [place]

## 2023-05-28 NOTE — Assessment & Plan Note (Signed)
Clinically stable on current regimen with good control of symptoms, No SI or HI. No change in management at this time. On Sertraline 100 mg and bupropion every other day.

## 2023-05-31 ENCOUNTER — Encounter: Payer: Self-pay | Admitting: Internal Medicine

## 2023-05-31 NOTE — Telephone Encounter (Signed)
Please review.  KP

## 2023-06-01 ENCOUNTER — Other Ambulatory Visit: Payer: Self-pay | Admitting: Internal Medicine

## 2023-06-01 DIAGNOSIS — Z6835 Body mass index (BMI) 35.0-35.9, adult: Secondary | ICD-10-CM

## 2023-06-01 MED ORDER — PHENTERMINE HCL 30 MG PO CAPS: 30.0000 mg | ORAL_CAPSULE | Freq: Two times a day (BID) | ORAL | 0 refills | Status: DC

## 2023-06-06 NOTE — Telephone Encounter (Signed)
Please review.  KP

## 2023-06-08 ENCOUNTER — Other Ambulatory Visit: Payer: Self-pay | Admitting: Internal Medicine

## 2023-06-08 DIAGNOSIS — F3341 Major depressive disorder, recurrent, in partial remission: Secondary | ICD-10-CM

## 2023-06-08 DIAGNOSIS — E890 Postprocedural hypothyroidism: Secondary | ICD-10-CM

## 2023-06-08 MED ORDER — ARMOUR THYROID 90 MG PO TABS: 360.0000 mg | ORAL_TABLET | Freq: Every day | ORAL | 0 refills | Status: DC

## 2023-06-25 ENCOUNTER — Ambulatory Visit: Payer: BC Managed Care – PPO | Admitting: Internal Medicine

## 2023-08-12 ENCOUNTER — Other Ambulatory Visit: Payer: Self-pay | Admitting: Internal Medicine

## 2023-08-12 DIAGNOSIS — E890 Postprocedural hypothyroidism: Secondary | ICD-10-CM

## 2023-08-12 DIAGNOSIS — Z3041 Encounter for surveillance of contraceptive pills: Secondary | ICD-10-CM

## 2023-08-14 NOTE — Telephone Encounter (Signed)
Requested medication (s) are due for refill today - unsure  Requested medication (s) are on the active medication list -yes  Future visit scheduled -no  Last refill: 01/05/23 #28  Notes to clinic: sent for provider review- not address at recent visits.  Requested Prescriptions  Pending Prescriptions Disp Refills   ERRIN 0.35 MG tablet [Pharmacy Med Name: Errin 0.35 MG Oral Tablet (Norethindrone)] 84 tablet 3    Sig: TAKE 1 TABLET BY MOUTH DAILY     OB/GYN: Contraceptives - Progestins Failed - 08/12/2023  5:37 AM      Failed - Patient is not a smoker      Passed - Last BP in normal range    BP Readings from Last 1 Encounters:  05/28/23 120/68         Passed - Valid encounter within last 12 months    Recent Outpatient Visits           2 months ago Major depressive disorder, recurrent episode, in partial remission (HCC)   [place] [facility] Medicine at [facility], Nyoka Cowden, MD   4 months ago BMI 35.0-35.9,adult   [facility] Primary Care & Sports Medicine at [facility], Nyoka Cowden, MD   5 months ago Major depressive disorder, recurrent episode, in partial remission [facility])   [place] Primary Care & Sports Medicine at [facility], Nyoka Cowden, MD   1 year ago Major depressive disorder, recurrent episode, in partial remission [facility])   [place] [facility] Care & Sports Medicine at [facility], Nyoka Cowden, MD   1 year ago Postoperative hypothyroidism   [place] [facility] Care & Sports Medicine at [facility], Nyoka Cowden, MD              Signed Prescriptions Disp Refills   ARMOUR THYROID 90 MG tablet 360 tablet 2    Sig: TAKE 4 TABLETS BY MOUTH DAILY     Endocrinology:  Hypothyroid Agents Failed - 08/12/2023  5:37 AM      Failed - TSH in normal range and within 360 days    TSH  Date Value Ref Range Status  04/10/2023 0.012 (L) 0.450 - 4.500 uIU/mL Final         Passed - Valid encounter within last  12 months    Recent Outpatient Visits           2 months ago Major depressive disorder, recurrent episode, in partial remission (HCC)   [place] [facility] & Sports Medicine at [facility], Nyoka Cowden, MD   4 months ago BMI 35.0-35.9,adult   [facility] Medicine at [facility], Nyoka Cowden, MD   5 months ago Major depressive disorder, recurrent episode, in partial remission [facility])   [place] [facility] Care & Sports Medicine at [facility], Nyoka Cowden, MD   1 year ago Major depressive disorder, recurrent episode, in partial remission [facility])   [place] Primary Care & Sports Medicine at [facility], Nyoka Cowden, MD   1 year ago Postoperative hypothyroidism   [place] Primary Care & Sports Medicine at [facility], Nyoka Cowden, MD                 Requested Prescriptions  Pending Prescriptions Disp Refills   ERRIN 0.35 MG tablet [Pharmacy Med Name: Errin 0.35 MG Oral Tablet (Norethindrone)] 84 tablet 3    Sig: TAKE 1 TABLET BY MOUTH DAILY  OB/GYN: Contraceptives - Progestins Failed - 08/12/2023  5:37 AM      Failed - Patient is not a smoker      Passed - Last BP in normal range    BP Readings from Last 1 Encounters:  05/28/23 120/68         Passed - Valid encounter within last 12 months    Recent Outpatient Visits           2 months ago Major depressive disorder, recurrent episode, in partial remission (HCC)   [place] Primary Care & Sports Medicine at [facility], Nyoka Cowden, MD   4 months ago BMI 35.0-35.9,adult   [facility] Primary Care & Sports Medicine at [facility], Nyoka Cowden, MD   5 months ago Major depressive disorder, recurrent episode, in partial remission [facility])   [place] Primary Care & Sports Medicine at [facility], Nyoka Cowden, MD   1 year ago Major depressive disorder, recurrent episode, in partial remission [facility])   [place]  Primary Care & Sports Medicine at [facility], Nyoka Cowden, MD   1 year ago Postoperative hypothyroidism   [place] [facility] Care & Sports Medicine at [facility], Nyoka Cowden, MD              Signed Prescriptions Disp Refills   ARMOUR THYROID 90 MG tablet 360 tablet 2    Sig: TAKE 4 TABLETS BY MOUTH DAILY     Endocrinology:  Hypothyroid Agents Failed - 08/12/2023  5:37 AM      Failed - TSH in normal range and within 360 days    TSH  Date Value Ref Range Status  04/10/2023 0.012 (L) 0.450 - 4.500 uIU/mL Final         Passed - Valid encounter within last 12 months    Recent Outpatient Visits           2 months ago Major depressive disorder, recurrent episode, in partial remission (HCC)   [place] Primary Care & Sports Medicine at [facility], Nyoka Cowden, MD   4 months ago BMI 35.0-35.9,adult   [facility] Health Primary Care & Sports Medicine at [facility], Nyoka Cowden, MD   5 months ago Major depressive disorder, recurrent episode, in partial remission [facility])   [place] Primary Care & Sports Medicine at [facility], Nyoka Cowden, MD   1 year ago Major depressive disorder, recurrent episode, in partial remission [facility])   [place] Primary Care & Sports Medicine at [facility], Nyoka Cowden, MD   1 year ago Postoperative hypothyroidism   [facility] Primary Care & Sports Medicine at [facility], Nyoka Cowden, MD

## 2023-08-14 NOTE — Telephone Encounter (Signed)
Rx 06/08/23 #360- early-but mail order so will send Requested Prescriptions  Pending Prescriptions Disp Refills   ARMOUR THYROID 90 MG tablet [Pharmacy Med Name: Armour Thyroid 90 MG Oral Tablet] 360 tablet 3    Sig: TAKE 4 TABLETS BY MOUTH DAILY     Endocrinology:  Hypothyroid Agents Failed - 08/12/2023  5:37 AM      Failed - TSH in normal range and within 360 days    TSH  Date Value Ref Range Status  04/10/2023 0.012 (L) 0.450 - 4.500 uIU/mL Final         Passed - Valid encounter within last 12 months    Recent Outpatient Visits           2 months ago Major depressive disorder, recurrent episode, in partial remission (HCC)   [place] [facility] at [facility], Nyoka Cowden, MD   4 months ago BMI 35.0-35.9,adult   [facility] at [facility], Nyoka Cowden, MD   5 months ago Major depressive disorder, recurrent episode, in partial remission [facility])   [place] [facility] at [facility], Nyoka Cowden, MD   1 year ago Major depressive disorder, recurrent episode, in partial remission [facility])   [place] [facility] at [facility], Nyoka Cowden, MD   1 year ago Postoperative hypothyroidism   [place] [facility] at [facility], Nyoka Cowden, MD               ERRIN 0.35 MG tablet [Pharmacy Med Name: Errin 0.35 MG Oral Tablet (Norethindrone)] 84 tablet 3    Sig: TAKE 1 TABLET BY MOUTH DAILY     OB/GYN: Contraceptives - Progestins Failed - 08/12/2023  5:37 AM      Failed - Patient is not a smoker      Passed - Last BP in normal range    BP Readings from Last 1 Encounters:  05/28/23 120/68         Passed - Valid encounter within last 12 months    Recent Outpatient Visits           2 months ago Major depressive disorder, recurrent episode, in partial remission (HCC)   [place] [facility] at  [facility], Nyoka Cowden, MD   4 months ago BMI 35.0-35.9,adult   [facility] at [facility], Nyoka Cowden, MD   5 months ago Major depressive disorder, recurrent episode, in partial remission [facility])   [place] [facility] at [facility], Nyoka Cowden, MD   1 year ago Major depressive disorder, recurrent episode, in partial remission [facility])    Primary Care & Sports Medicine at [facility], Nyoka Cowden, MD   1 year ago Postoperative hypothyroidism   [facility] at [facility], Nyoka Cowden, MD

## 2023-08-28 ENCOUNTER — Ambulatory Visit (INDEPENDENT_AMBULATORY_CARE_PROVIDER_SITE_OTHER): Payer: BC Managed Care – PPO | Admitting: Internal Medicine

## 2023-08-28 VITALS — BP 112/72 | HR 104 | Temp 97.8°F | Ht 63.0 in | Wt 191.0 lb

## 2023-08-28 DIAGNOSIS — E890 Postprocedural hypothyroidism: Secondary | ICD-10-CM

## 2023-08-28 DIAGNOSIS — N761 Subacute and chronic vaginitis: Secondary | ICD-10-CM

## 2023-08-28 DIAGNOSIS — J029 Acute pharyngitis, unspecified: Secondary | ICD-10-CM

## 2023-08-28 MED ORDER — AZITHROMYCIN 250 MG PO TABS: ORAL_TABLET | ORAL | 0 refills | Status: AC

## 2023-08-28 MED ORDER — TERCONAZOLE 0.8 % VA CREA: 1.0000 | TOPICAL_CREAM | Freq: Every day | VAGINAL | 0 refills | Status: AC

## 2023-08-28 NOTE — Progress Notes (Signed)
Date:  08/28/2023   Name:  Brandi Dominguez   DOB:  11-Mar-1971   MRN:  [id number]   Chief Complaint: Fever (Started Sunday. Tested herself for Covid yesterday and it was negative. Fever, body aches, chills, sore throat, and taste bud changes. )  Sore Throat  This is a new problem. The current episode started yesterday. The problem has been unchanged. Neither side of throat is experiencing more pain than the other. The maximum temperature recorded prior to her arrival was 100.4 - 100.9 F. The fever has been present for Less than 1 day. The pain is moderate. Associated symptoms include a hoarse voice, swollen glands and trouble swallowing. Pertinent negatives include no coughing, diarrhea, ear pain, headaches, plugged ear sensation, shortness of breath or vomiting. She has had no exposure to strep or mono. She has tried acetaminophen for the symptoms. The treatment provided mild relief.    Lab Results  Component Value Date   NA 142 04/10/2023   K 4.8 04/10/2023   CO2 24 04/10/2023   GLUCOSE 82 04/10/2023   BUN 18 04/10/2023   CREATININE 0.98 04/10/2023   CALCIUM 9.1 04/10/2023   EGFR 71 04/10/2023   GFRNONAA 80 10/25/2018   No results found for: "CHOL", "HDL", "LDLCALC", "LDLDIRECT", "TRIG", "CHOLHDL" Lab Results  Component Value Date   TSH 0.012 (L) 04/10/2023   Lab Results  Component Value Date   HGBA1C 5.7 (H) 04/10/2023   Lab Results  Component Value Date   WBC 10.5 04/10/2023   HGB 13.3 04/10/2023   HCT 41.1 04/10/2023   MCV 84 04/10/2023   PLT 322 04/10/2023   Lab Results  Component Value Date   ALT 11 04/10/2023   AST 15 04/10/2023   ALKPHOS 85 04/10/2023   BILITOT <0.2 04/10/2023   Lab Results  Component Value Date   VD25OH 36.0 10/25/2020     Review of Systems  Constitutional:  Positive for chills, fatigue and fever.  HENT:  Positive for hoarse voice, sore throat and trouble swallowing. Negative for ear pain.   Respiratory:  Negative for cough, chest  tightness, shortness of breath and wheezing.   Cardiovascular:  Negative for chest pain.  Gastrointestinal:  Positive for nausea. Negative for diarrhea and vomiting.  Musculoskeletal:  Positive for myalgias.  Neurological:  Negative for dizziness and headaches.    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   Irritable bowel syndrome 09/01/2016   Personal history of peptic ulcer disease 09/01/2016   GERD (gastroesophageal reflux disease) 09/01/2016    Allergies  Allergen Reactions   Diflucan [Fluconazole] Anaphylaxis   Venlafaxine Anxiety and Palpitations    Past Surgical History:  Procedure Laterality Date   CHOLECYSTECTOMY  2012   THYROIDECTOMY N/A 03/25/2018   Procedure: THYROIDECTOMY;  Surgeon: Vernie Murders, MD;  Location: [facility] ORS;  Service: ENT;  Laterality: N/A;   TONSILLECTOMY AND ADENOIDECTOMY      Social History   Tobacco Use   Smoking status: Every Day    Current packs/day: 0.25    Average packs/day: 0.3 packs/day for 15.0 years (3.8 ttl  pk-yrs)    Types: Cigarettes   Smokeless tobacco: Never   Tobacco comments:    5 ciggs daily  Vaping Use   Vaping status: Never Used  Substance Use Topics   Alcohol use: No   Drug use: No     Medication list has been reviewed and updated.  Current Meds  Medication Sig   acetaminophen (TYLENOL) 325 MG tablet Take 650 mg by mouth every 6 (six) hours as needed (for pain/headaches.).   ARMOUR THYROID 90 MG tablet TAKE 4 TABLETS BY MOUTH DAILY   azithromycin (ZITHROMAX Z-PAK) 250 MG tablet UAD    buPROPion (WELLBUTRIN XL) 150 MG 24 hr tablet TAKE 2 TABLETS DAILY (Patient taking differently: Take 150 mg by mouth every other day.)   clonazePAM (KLONOPIN) 0.5 MG tablet Take 1 tablet (0.5 mg total) by mouth 2 (two) times daily as needed for anxiety.   cyanocobalamin (VITAMIN B12) 1000 MCG/ML injection Inject 1 mL (1,000 mcg total) into the muscle every 14 (fourteen) days.   dicyclomine (BENTYL) 10 MG capsule Take 1 capsule (10 mg total) by mouth 4 (four) times daily -  before meals and at bedtime.   hyoscyamine (LEVSIN) 0.125 MG tablet Take 1 tablet (0.125 mg total) by mouth every 4 (four) hours as needed (for abdominal pain/spasms.).   norethindrone (ERRIN) 0.35 MG tablet Take 1 tablet (0.35 mg total) by mouth daily.   phentermine 30 MG capsule Take 1 capsule (30 mg total) by mouth in the morning and at bedtime.   sertraline (ZOLOFT) 100 MG tablet TAKE 1 TABLET BY MOUTH DAILY   Syringe/Needle, Disp, (SYRINGE 3CC/25GX1") 25G X 1" 3 ML MISC 1 each by Does not apply route every 14 (fourteen) days.   terconazole (TERAZOL 3) 0.8 % vaginal cream Place 1 applicator vaginally at bedtime.   triamcinolone cream (KENALOG) 0.1 % Apply 1 application. topically 2 (two) times daily as needed (for rash due to sun exposure. (polymorphic light eruption)).       08/28/2023    1:52 PM 05/28/2023    1:57 PM 02/20/2023    1:27 PM 05/12/2022    2:16 PM  GAD 7 : Generalized Anxiety Score  Nervous, Anxious, on Edge 2 2 2 2   Control/stop worrying 2 2 2 2   Worry too much - different things 2 2 2 3   Trouble relaxing 1 1 1 1   Restless 2 2 1 2   Easily annoyed or irritable 0 0 0 1  Afraid - awful might happen 2 2 3 3   Total GAD 7 Score 11 11 11 14   Anxiety Difficulty Somewhat difficult Somewhat difficult Somewhat difficult Somewhat difficult       08/28/2023    1:52 PM 05/28/2023    1:56 PM 02/20/2023    1:27 PM  Depression screen PHQ 2/9  Decreased Interest 1 1 1   Down, Depressed, Hopeless 1 1 1   PHQ - 2 Score 2  2 2   Altered sleeping 1 1 1   Tired, decreased energy 3 2 1   Change in appetite 3 2 1   Feeling bad or failure about yourself  0 2 1  Trouble concentrating 0 3 2  Moving slowly or fidgety/restless 0 2 1  Suicidal thoughts 0 0 0  PHQ-9 Score 9 14 9   Difficult doing work/chores Not difficult at all Somewhat difficult Not difficult at all    BP Readings from Last 3 Encounters:  08/28/23 112/72  05/28/23 120/68  04/10/23 122/60    Physical Exam Constitutional:  Appearance: She is ill-appearing.  HENT:     Right Ear: Tympanic membrane normal.     Left Ear: Tympanic membrane normal.     Nose:     Right Sinus: No maxillary sinus tenderness.     Left Sinus: No maxillary sinus tenderness.     Mouth/Throat:     Tongue: No lesions.     Pharynx: Oropharyngeal exudate and posterior oropharyngeal erythema present.     Tonsils: No tonsillar exudate.  Neck:     Vascular: No carotid bruit.  Cardiovascular:     Rate and Rhythm: Normal rate and regular rhythm.     Heart sounds: No murmur heard. Pulmonary:     Effort: Pulmonary effort is normal.     Breath sounds: Normal breath sounds.  Musculoskeletal:     Cervical back: Normal range of motion.     Right lower leg: No edema.     Left lower leg: No edema.  Lymphadenopathy:     Cervical: Cervical adenopathy present.  Neurological:     Mental Status: She is alert.     Wt Readings from Last 3 Encounters:  08/28/23 191 lb (86.6 kg)  05/28/23 199 lb (90.3 kg)  04/10/23 197 lb 12.8 oz (89.7 kg)    BP 112/72   Pulse (!) 104   Temp 97.8 F (36.6 C) (Oral)   Ht 5\' 3"  (1.6 m)   Wt 191 lb (86.6 kg)   SpO2 98%   BMI 33.83 kg/m   Assessment and Plan:  Problem List Items Addressed This Visit       Unprioritized   Status post total thyroidectomy (Chronic)   Other Visit Diagnoses     Pharyngitis, unspecified etiology    -  Primary   continue Tylenol, rest and warm fluids   Relevant Medications   azithromycin (ZITHROMAX  Z-PAK) 250 MG tablet   Subacute vaginitis       use terazole cream for prevention   Relevant Medications   terconazole (TERAZOL 3) 0.8 % vaginal cream       No follow-ups on file.    Reubin Milan, MD [facility] [place]

## 2023-09-04 ENCOUNTER — Other Ambulatory Visit: Payer: Self-pay | Admitting: Internal Medicine

## 2023-09-04 DIAGNOSIS — Z3041 Encounter for surveillance of contraceptive pills: Secondary | ICD-10-CM

## 2023-09-05 ENCOUNTER — Other Ambulatory Visit: Payer: Self-pay | Admitting: Internal Medicine

## 2023-10-12 ENCOUNTER — Ambulatory Visit: Payer: Self-pay

## 2024-01-02 ENCOUNTER — Ambulatory Visit: Payer: 59 | Admitting: Internal Medicine

## 2024-01-02 ENCOUNTER — Encounter: Payer: Self-pay | Admitting: Internal Medicine

## 2024-01-02 VITALS — BP 112/70 | HR 86 | Ht 63.0 in | Wt 215.0 lb

## 2024-01-02 DIAGNOSIS — Z6835 Body mass index (BMI) 35.0-35.9, adult: Secondary | ICD-10-CM

## 2024-01-02 DIAGNOSIS — N912 Amenorrhea, unspecified: Secondary | ICD-10-CM | POA: Insufficient documentation

## 2024-01-02 DIAGNOSIS — E890 Postprocedural hypothyroidism: Secondary | ICD-10-CM | POA: Diagnosis not present

## 2024-01-02 DIAGNOSIS — R7303 Prediabetes: Secondary | ICD-10-CM | POA: Diagnosis not present

## 2024-01-02 DIAGNOSIS — Z780 Asymptomatic menopausal state: Secondary | ICD-10-CM

## 2024-01-02 MED ORDER — PHENTERMINE HCL 30 MG PO CAPS: 30.0000 mg | ORAL_CAPSULE | Freq: Two times a day (BID) | ORAL | 2 refills | Status: AC

## 2024-01-02 NOTE — Assessment & Plan Note (Signed)
 Continue to work on diet changes but has gained some weight. Will check labs

## 2024-01-02 NOTE — Assessment & Plan Note (Signed)
 Would like to try Phentermine again. BP is normal and it has been 6 mo since the last course. Will give another 3 mo.

## 2024-01-02 NOTE — Progress Notes (Signed)
 Date:  01/02/2024   Name:  Brandi Dominguez   DOB:  08/30/1971   MRN:  [id number]   Chief Complaint: Hypothyroidism  Thyroid  Problem Presents for follow-up visit. Symptoms include menstrual problem. Patient reports no anxiety, constipation, diarrhea, fatigue or palpitations. The symptoms have been stable.    Review of Systems  Constitutional:  Positive for unexpected weight change. Negative for chills, fatigue and fever.  HENT:  Negative for trouble swallowing.   Respiratory:  Negative for chest tightness and shortness of breath.   Cardiovascular:  Negative for chest pain, palpitations and leg swelling.  Gastrointestinal:  Negative for constipation and diarrhea.  Genitourinary:  Positive for menstrual problem.  Neurological:  Negative for dizziness and headaches.  Psychiatric/Behavioral:  Negative for dysphoric mood. The patient is not nervous/anxious.      Lab Results  Component Value Date   NA 142 04/10/2023   K 4.8 04/10/2023   CO2 24 04/10/2023   GLUCOSE 82 04/10/2023   BUN 18 04/10/2023   CREATININE 0.98 04/10/2023   CALCIUM  9.1 04/10/2023   EGFR 71 04/10/2023   GFRNONAA 80 10/25/2018   No results found for: CHOL, HDL, LDLCALC, LDLDIRECT, TRIG, CHOLHDL Lab Results  Component Value Date   TSH 0.012 (L) 04/10/2023   Lab Results  Component Value Date   HGBA1C 5.7 (H) 04/10/2023   Lab Results  Component Value Date   WBC 10.5 04/10/2023   HGB 13.3 04/10/2023   HCT 41.1 04/10/2023   MCV 84 04/10/2023   PLT 322 04/10/2023   Lab Results  Component Value Date   ALT 11 04/10/2023   AST 15 04/10/2023   ALKPHOS 85 04/10/2023   BILITOT <0.2 04/10/2023   Lab Results  Component Value Date   VD25OH 36.0 10/25/2020     Patient Active Problem List   Diagnosis Date Noted   Amenorrhea 01/02/2024   BMI 35.0-35.9,adult 04/10/2023   Prediabetes 12/13/2021   Tear of meniscus of right knee 05/30/2019   Panic attacks 05/30/2019   Major depressive disorder,  recurrent episode, in partial remission (HCC) 10/25/2018   Tobacco use disorder 10/25/2018   Family history of premature CAD 10/25/2018   Status post total thyroidectomy 03/25/2018   Arthralgia of both hands 08/20/2017   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   Irritable bowel syndrome 09/01/2016   Personal history of peptic ulcer disease 09/01/2016   GERD (gastroesophageal reflux disease) 09/01/2016    Allergies  Allergen Reactions   Diflucan [Fluconazole] Anaphylaxis   Venlafaxine  Anxiety and Palpitations    Past Surgical History:  Procedure Laterality Date   CHOLECYSTECTOMY  2012   THYROIDECTOMY N/A 03/25/2018   Procedure: THYROIDECTOMY;  Surgeon: Edda Mt, MD;  Location: [facility] ORS;  Service: ENT;  Laterality: N/A;   TONSILLECTOMY AND ADENOIDECTOMY      Social History   Tobacco Use   Smoking status: Every Day    Current packs/day: 0.25    Average packs/day: 0.3 packs/day for 15.0 years (3.8 ttl pk-yrs)    Types: Cigarettes   Smokeless tobacco: Never   Tobacco comments:    5 ciggs daily  Vaping Use   Vaping status: Never Used  Substance Use Topics   Alcohol use: No   Drug use: No     Medication list has been reviewed and updated.  Current Meds  Medication Sig   acetaminophen  (TYLENOL ) 325 MG tablet Take 650 mg by mouth every 6 (six) hours as needed (for pain/headaches.).   ARMOUR THYROID  90 MG tablet TAKE  4 TABLETS BY MOUTH DAILY   clonazePAM  (KLONOPIN ) 0.5 MG tablet Take 1 tablet (0.5 mg total) by mouth 2 (two) times daily as needed for anxiety.   cyanocobalamin  (VITAMIN B12) 1000 MCG/ML injection Inject 1 mL (1,000 mcg total) into the muscle every 14 (fourteen) days.   dicyclomine  (BENTYL ) 10 MG capsule Take 1 capsule (10 mg total) by mouth 4 (four) times daily -  before meals and at bedtime.   ERRIN  0.35 MG tablet TAKE 1 TABLET BY MOUTH DAILY   hyoscyamine  (LEVSIN ) 0.125 MG tablet Take 1 tablet (0.125 mg total) by mouth every 4 (four) hours as needed (for  abdominal pain/spasms.).   sertraline  (ZOLOFT ) 100 MG tablet TAKE 1 TABLET BY MOUTH DAILY   Syringe/Needle, Disp, (SYRINGE 3CC/25GX1) 25G X 1 3 ML MISC 1 each by Does not apply route every 14 (fourteen) days.   terconazole  (TERAZOL 3 ) 0.8 % vaginal cream Place 1 applicator vaginally at bedtime.   triamcinolone  cream (KENALOG ) 0.1 % Apply 1 application. topically 2 (two) times daily as needed (for rash due to sun exposure. (polymorphic light eruption)).   [DISCONTINUED] buPROPion  (WELLBUTRIN  XL) 150 MG 24 hr tablet TAKE 2 TABLETS DAILY (Patient taking differently: Take 150 mg by mouth every other day.)   [DISCONTINUED] phentermine  30 MG capsule Take 1 capsule (30 mg total) by mouth in the morning and at bedtime.       01/02/2024    2:02 PM 08/28/2023    1:52 PM 05/28/2023    1:57 PM 02/20/2023    1:27 PM  GAD 7 : Generalized Anxiety Score  Nervous, Anxious, on Edge 0 2 2 2   Control/stop worrying 0 2 2 2   Worry too much - different things 0 2 2 2   Trouble relaxing 0 1 1 1   Restless 0 2 2 1   Easily annoyed or irritable 0 0 0 0  Afraid - awful might happen 0 2 2 3   Total GAD 7 Score 0 11 11 11   Anxiety Difficulty Not difficult at all Somewhat difficult Somewhat difficult Somewhat difficult       01/02/2024    2:02 PM 08/28/2023    1:52 PM 05/28/2023    1:56 PM  Depression screen PHQ 2/9  Decreased Interest 0 1 1  Down, Depressed, Hopeless 0 1 1  PHQ - 2 Score 0 2 2  Altered sleeping 0 1 1  Tired, decreased energy 0 3 2  Change in appetite 0 3 2  Feeling bad or failure about yourself  0 0 2  Trouble concentrating 0 0 3  Moving slowly or fidgety/restless 0 0 2  Suicidal thoughts 0 0 0  PHQ-9 Score 0 9 14  Difficult doing work/chores Not difficult at all Not difficult at all Somewhat difficult    BP Readings from Last 3 Encounters:  01/02/24 112/70  08/28/23 112/72  05/28/23 120/68    Physical Exam Vitals and nursing note reviewed.  Constitutional:      General: She is not in  acute distress.    Appearance: Normal appearance. She is well-developed.  HENT:     Head: Normocephalic and atraumatic.  Cardiovascular:     Rate and Rhythm: Normal rate and regular rhythm.  Pulmonary:     Effort: Pulmonary effort is normal. No respiratory distress.     Breath sounds: No wheezing or rhonchi.  Musculoskeletal:     Cervical back: Normal range of motion. No tenderness.     Right lower leg: No edema.  Skin:  General: Skin is warm and dry.     Findings: No rash.  Neurological:     Mental Status: She is alert and oriented to person, place, and time.  Psychiatric:        Mood and Affect: Mood normal.        Behavior: Behavior normal.     Wt Readings from Last 3 Encounters:  01/02/24 215 lb (97.5 kg)  08/28/23 191 lb (86.6 kg)  05/28/23 199 lb (90.3 kg)    BP 112/70   Pulse 86   Ht 5' 3 (1.6 m)   Wt 215 lb (97.5 kg)   SpO2 98%   BMI 38.09 kg/m   Assessment and Plan:  Problem List Items Addressed This Visit       Unprioritized   Status post total thyroidectomy - Primary (Chronic)   Thyroid  supplemented with Armour Thyroid  360 mg daily. Will recheck labs. She may not be able to afford it with her new insurance.      Relevant Orders   Thyroid  Panel With TSH   Prediabetes (Chronic)   Continue to work on diet changes but has gained some weight. Will check labs      Relevant Orders   Hemoglobin A1c   BMI 35.0-35.9,adult   Would like to try Phentermine  again. BP is normal and it has been 6 mo since the last course. Will give another 3 mo.      Relevant Medications   phentermine  30 MG capsule   Amenorrhea   Other Visit Diagnoses       Menopause         Postoperative hypothyroidism           Return in about 3 months (around 04/01/2024) for Thyroid .    Leita HILARIO Adie, MD [facility] [place]

## 2024-01-02 NOTE — Telephone Encounter (Signed)
 Please review.  KP

## 2024-01-02 NOTE — Assessment & Plan Note (Signed)
 Thyroid supplemented with Armour Thyroid 360 mg daily. Will recheck labs. She may not be able to afford it with her new insurance.

## 2024-01-03 LAB — HEMOGLOBIN A1C
Est. average glucose Bld gHb Est-mCnc: 126 mg/dL
Hgb A1c MFr Bld: 6 % — ABNORMAL HIGH (ref 4.8–5.6)

## 2024-01-03 LAB — THYROID PANEL WITH TSH
Free Thyroxine Index: 1.7 (ref 1.2–4.9)
T3 Uptake Ratio: 26 % (ref 24–39)
T4, Total: 6.4 ug/dL (ref 4.5–12.0)
TSH: 0.196 u[IU]/mL — ABNORMAL LOW (ref 0.450–4.500)

## 2024-01-07 ENCOUNTER — Encounter: Payer: Self-pay | Admitting: Internal Medicine

## 2024-01-07 ENCOUNTER — Telehealth: Payer: Self-pay

## 2024-01-07 NOTE — Telephone Encounter (Signed)
 FYI  KP

## 2024-01-07 NOTE — Telephone Encounter (Signed)
 Completed PA on [email or web address] for Phentermine 30 mg.   (Key: [id number])  PA Case ID #: [id number]  - Caitriona Sundquist

## 2024-01-08 ENCOUNTER — Other Ambulatory Visit: Payer: Self-pay

## 2024-01-08 DIAGNOSIS — Z3041 Encounter for surveillance of contraceptive pills: Secondary | ICD-10-CM

## 2024-01-08 MED ORDER — NORETHINDRONE 0.35 MG PO TABS: 1.0000 | ORAL_TABLET | Freq: Every day | ORAL | 3 refills | Status: AC

## 2024-01-08 NOTE — Telephone Encounter (Signed)
 PA was Denied.  "Your plan only covers this drug for a total of 3 months within a 365-day period for your health condition."

## 2024-01-11 ENCOUNTER — Ambulatory Visit: Payer: 59 | Admitting: Internal Medicine

## 2024-02-18 ENCOUNTER — Other Ambulatory Visit: Payer: Self-pay | Admitting: Internal Medicine

## 2024-02-18 ENCOUNTER — Encounter: Payer: Self-pay | Admitting: Internal Medicine

## 2024-02-18 DIAGNOSIS — E890 Postprocedural hypothyroidism: Secondary | ICD-10-CM

## 2024-02-18 MED ORDER — THYROID 180 MG PO TABS: 360.0000 mg | ORAL_TABLET | Freq: Every day | ORAL | 2 refills | Status: DC

## 2024-03-12 ENCOUNTER — Encounter: Payer: Self-pay | Admitting: Nurse Practitioner

## 2024-03-12 ENCOUNTER — Ambulatory Visit: Payer: Self-pay | Admitting: Nurse Practitioner

## 2024-03-12 DIAGNOSIS — A5139 Other secondary syphilis of skin: Secondary | ICD-10-CM

## 2024-03-12 DIAGNOSIS — Z113 Encounter for screening for infections with a predominantly sexual mode of transmission: Secondary | ICD-10-CM

## 2024-03-12 LAB — HEPATITIS B SURFACE ANTIGEN

## 2024-03-12 LAB — WET PREP FOR TRICH, YEAST, CLUE
Trichomonas Exam: NEGATIVE
Yeast Exam: NEGATIVE

## 2024-03-12 LAB — HM HIV SCREENING LAB: HM HIV Screening: NEGATIVE

## 2024-03-12 LAB — HM HEPATITIS C SCREENING LAB: HM Hepatitis Screen: NEGATIVE

## 2024-03-12 MED ORDER — PENICILLIN G BENZATHINE 1200000 UNIT/2ML IM SUSY
2.4000 10*6.[IU] | PREFILLED_SYRINGE | INTRAMUSCULAR | Status: AC
  Administered 2024-03-12: 2.4 10*6.[IU] via INTRAMUSCULAR

## 2024-03-12 NOTE — Progress Notes (Signed)
 [facility] Department STI clinic [address] [place] [address] Main phone: [phone]  STI screening visit  Subjective:  Brandi Dominguez is a 53 y.o. female being seen today for an STI screening visit. The patient reports they do have symptoms.  Patient reports that they do not desire a pregnancy in the next year.   They reported they are not interested in discussing contraception today.    No LMP recorded (lmp unknown). Patient is postmenopausal.  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 female who presents to the office today requesting symptomatic STI testing. The patient reports she noticed a rash that began on her feet and has spread to the entire body sparing the palms of her hands. She states she called her partner and he told her he had tested positive for syphilis and received treatment in December last year (3 months ago). Patient states she was unaware of this and had never been treated. She also indicates a sore in her mouth 1 week after the rash started that has now resolved. Additionally, the patient reports being diagnosed with a cyst on her right ovary that causes her pain  sometimes. Patient indicates 1 female partner in the last 2 months. She reports practicing vaginal and oral sex and uses condoms sometimes. Patient indicates no STI history. Patient reports last sex was about 2 weeks ago. She indicates being postmenopausal as contraception method.   Does the patient using douching products? Not assessed.  Last HIV test per patient/review of record was  Lab Results  Component Value Date   HIV Non Reactive 12/13/2021     Last HEPC test per patient/review of record was No results found for: "HMHEPCSCREEN" No components found for: "HEPC"   Last HEPB test per patient/review of record was No components found for: "HMHEPBSCREEN"   Patient reports last pap was:  Result Date Procedure Results Follow-ups  02/13/2020 HM PAP SMEAR HM Pap smear: neg with cotesting     Screening for MPX risk: Does the patient have an unexplained rash? No Is the patient MSM? No Does the patient endorse multiple sex partners or anonymous sex partners? No Did the patient have close or sexual contact with a person diagnosed with MPX? No Has the patient traveled outside the [place] where MPX is endemic? No Is there a high clinical suspicion for MPX-- evidenced by one of the following No  -Unlikely to be chickenpox  -Lymphadenopathy  -Rash that present in same phase of evolution on any given body part See flowsheet for further details and programmatic requirements.   Immunization history:  Immunization History  Administered Date(s) Administered   Influenza,inj,Quad PF,6+ Mos 10/20/2016, 09/20/2018, 10/25/2020, 11/04/2021, 10/13/2022   Influenza-Unspecified 10/09/2023  Moderna Sars-Covid-2 Vaccination 08/06/2020, 09/03/2020     The following portions of the patient's history were reviewed and updated as appropriate: allergies, current medications, past medical history, past social history, past surgical history and problem list.  Objective:  There were no vitals filed for this  visit.  Physical Exam Nursing note reviewed. Chaperone present: Declined chaperone..  Constitutional:      Appearance: Normal appearance.  HENT:     Head: Normocephalic.     Salivary Glands: Right salivary gland is not diffusely enlarged or tender. Left salivary gland is not diffusely enlarged or tender.     Mouth/Throat:     Lips: Pink. No lesions.     Mouth: Mucous membranes are moist.     Tongue: No lesions. Tongue does not deviate from midline.     Pharynx: Oropharynx is clear. Uvula midline.     Tonsils: No tonsillar exudate.  Eyes:     General:        Right eye: No discharge.        Left eye: No discharge.  Pulmonary:     Effort: Pulmonary effort is normal.  Genitourinary:    General: Normal vulva.     Exam position: Lithotomy position.     Pubic Area: No rash or pubic lice.      Tanner stage (genital): 5.     Labia:        Right: No rash, tenderness, lesion or injury.        Left: No rash, tenderness, lesion or injury.      Vagina: Normal. No vaginal discharge, erythema, tenderness, bleeding or lesions.     Cervix: Normal. No cervical motion tenderness, discharge, friability, lesion, erythema, cervical bleeding or eversion.     Uterus: Normal.      Adnexa: Right adnexa normal and left adnexa normal.     Comments: pH<4.5 Lymphadenopathy:     Head:     Right side of head: No submental, submandibular, tonsillar, preauricular or posterior auricular adenopathy.     Left side of head: No submental, submandibular, tonsillar, preauricular or posterior auricular adenopathy.     Cervical: No cervical adenopathy.     Right cervical: No superficial or posterior cervical adenopathy.    Left cervical: No superficial or posterior cervical adenopathy.     Upper Body:     Right upper body: No supraclavicular or axillary adenopathy.     Left upper body: No supraclavicular or axillary adenopathy.     Lower Body: No right inguinal adenopathy. No left inguinal adenopathy.  Skin:     General: Skin is warm and dry.     Findings: Rash present.     Comments: Patient with rash to soles of feet, legs, arms, abdomen, and back. Rash sparing palms of hands and face.  Skin tone appropriate for ethnicity.   Neurological:     Mental Status: She is alert and oriented to person, place, and time.  Psychiatric:        Attention and Perception: Attention and perception normal.        Mood and Affect: Mood and affect normal.        Speech: Speech normal.        Behavior: Behavior normal. Behavior is cooperative.        Thought Content: Thought content normal.     Assessment and Plan:  Brandi Dominguez is a 52 y.o. female presenting to the [facility] Department for STI screening  1. Screening for venereal disease (  Primary) Wet prep negative in office.  - Chlamydia/Gonorrhea [facility] - Gonococcus culture - HBV Antigen/Antibody [facility] - HIV/HCV [place] [facility] - WET PREP FOR TRICH, YEAST, CLUE - Syphilis Serology, [facility]  Serology, [place] [facility]  2. Rash of secondary syphilis Patient with rash highly suspicious of syphilitic rash when considering pt is a contact to a known positive.  Treating patient per CDC guidelines as secondary syphilis with one time Bicillin injection.  - penicillin g benzathine (BICILLIN LA) 1200000 UNIT/2ML injection 2.4 Million Units  Patient accepted all screenings including oral, vaginal CT/GC and bloodwork for HIV/RPR, and wet prep. Patient meets criteria for HepB screening? Yes. Ordered? yes Patient meets criteria for HepC screening? Yes. Ordered? yes  Treat wet prep per standing order Discussed time line for [facility] results and that patient will be called with positive results and encouraged patient to call if she had not heard in 2 weeks.  Counseled to return or seek care for continued or worsening symptoms Recommended repeat testing in 3 months with positive results. Recommended condom use with all sex for STI prevention.    Patient is currently  postmenopausal  to prevent pregnancy.    Return if symptoms worsen or fail to improve.  No future appointments.  Total time with patient 20 minutes.  Edmonia James, NP

## 2024-03-12 NOTE — Progress Notes (Unsigned)
 Pt is here for STD testing.  Wet mount results reviewed.  Bicillin 2.4 MU given IM.  Pt monitored for 15 minutes and tolerated well.  Condoms declined.  Berdie Ogren, RN

## 2024-03-13 ENCOUNTER — Encounter: Payer: Self-pay | Admitting: Nurse Practitioner

## 2024-03-17 LAB — GONOCOCCUS CULTURE

## 2024-03-20 ENCOUNTER — Telehealth: Payer: Self-pay

## 2024-03-20 LAB — SYPHILIS SEROLOGY, ~~LOC~~ LAB
RPR, Quant: 1:256 {titer}
RPR: REACTIVE
Syphilis Treponemal Ab: REACTIVE

## 2024-03-20 NOTE — Telephone Encounter (Signed)
 Call pt re syphilis results from 03/12/24 specimen. RPR = Reactive Titer= 1;256 TP= Reactive  03/12/24 order to tx with Bicillin 2.4 MU x1 completed during visit on 03/12/24.  Provider recommended repeat testing in 3 mths with positive results.

## 2024-03-20 NOTE — Telephone Encounter (Signed)
 Phone call to pt and pt confirmed password.  Pt counseled re 03/12/24 syphilis results. Counseled she had already received the treatment needed during her 03/12/24 visit.  Pt counseled to RTC in 3 months for f/u testing.

## 2024-05-01 ENCOUNTER — Encounter: Payer: Self-pay | Admitting: Internal Medicine

## 2024-05-01 DIAGNOSIS — F3341 Major depressive disorder, recurrent, in partial remission: Secondary | ICD-10-CM

## 2024-05-01 MED ORDER — SERTRALINE HCL 100 MG PO TABS: 100.0000 mg | ORAL_TABLET | Freq: Every day | ORAL | 0 refills | Status: DC

## 2024-06-11 ENCOUNTER — Other Ambulatory Visit: Payer: Self-pay | Admitting: Internal Medicine

## 2024-06-11 DIAGNOSIS — E890 Postprocedural hypothyroidism: Secondary | ICD-10-CM

## 2024-06-13 NOTE — Telephone Encounter (Signed)
 Requested medications are due for refill today.  yes  Requested medications are on the active medications list.  yes  Last refill. 02/18/2024 #60 2 rf  Future visit scheduled.   no  Notes to clinic.  Abnormal labs.    Requested Prescriptions  Pending Prescriptions Disp Refills   ARMOUR THYROID  180 MG tablet [Pharmacy Med Name: ARMOUR THYROID  180 MG TABLET] 60 tablet 2    Sig: TAKE 2 TABLETS (360 MG TOTAL) BY MOUTH DAILY     Endocrinology:  Hypothyroid Agents Failed - 06/13/2024  2:37 PM      Failed - TSH in normal range and within 360 days    TSH  Date Value Ref Range Status  01/02/2024 0.196 (L) 0.450 - 4.500 uIU/mL Final         Failed - Valid encounter within last 12 months    Recent Outpatient Visits   None

## 2024-07-07 ENCOUNTER — Encounter: Payer: Self-pay | Admitting: Internal Medicine

## 2024-07-08 ENCOUNTER — Other Ambulatory Visit: Payer: Self-pay | Admitting: Internal Medicine

## 2024-07-08 DIAGNOSIS — E890 Postprocedural hypothyroidism: Secondary | ICD-10-CM

## 2024-07-08 NOTE — Progress Notes (Unsigned)
 Date:  07/08/2024   Name:  Brandi Dominguez   DOB:  1971-05-17   MRN:  [id number]   Chief Complaint: No chief complaint on file.  HPI  Review of Systems   Lab Results  Component Value Date   NA 142 04/10/2023   K 4.8 04/10/2023   CO2 24 04/10/2023   GLUCOSE 82 04/10/2023   BUN 18 04/10/2023   CREATININE 0.98 04/10/2023   CALCIUM  9.1 04/10/2023   EGFR 71 04/10/2023   GFRNONAA 80 10/25/2018   No results found for: CHOL, HDL, LDLCALC, LDLDIRECT, TRIG, CHOLHDL Lab Results  Component Value Date   TSH 0.196 (L) 01/02/2024   Lab Results  Component Value Date   HGBA1C 6.0 (H) 01/02/2024   Lab Results  Component Value Date   WBC 10.5 04/10/2023   HGB 13.3 04/10/2023   HCT 41.1 04/10/2023   MCV 84 04/10/2023   PLT 322 04/10/2023   Lab Results  Component Value Date   ALT 11 04/10/2023   AST 15 04/10/2023   ALKPHOS 85 04/10/2023   BILITOT <0.2 04/10/2023   Lab Results  Component Value Date   VD25OH 36.0 10/25/2020     Patient Active Problem List   Diagnosis Date Noted   Amenorrhea 01/02/2024   BMI 35.0-35.9,adult 04/10/2023   Prediabetes 12/13/2021   Tear of meniscus of right knee 05/30/2019   Panic attacks 05/30/2019   Major depressive disorder, recurrent episode, in partial remission (HCC) 10/25/2018   Tobacco use disorder 10/25/2018   Family history of premature CAD 10/25/2018   Status post total thyroidectomy 03/25/2018   Arthralgia of both hands 08/20/2017   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   Irritable bowel syndrome 09/01/2016   Personal history of peptic ulcer disease 09/01/2016   GERD (gastroesophageal reflux disease) 09/01/2016    Allergies  Allergen Reactions   Diflucan [Fluconazole] Anaphylaxis   Venlafaxine  Anxiety and Palpitations    Past Surgical History:  Procedure Laterality Date   CHOLECYSTECTOMY  2012   THYROIDECTOMY N/A 03/25/2018   Procedure: THYROIDECTOMY;  Surgeon: Edda Mt, MD;  Location: [facility] ORS;  Service:  ENT;  Laterality: N/A;   TONSILLECTOMY AND ADENOIDECTOMY      Social History   Tobacco Use   Smoking status: Every Day    Current packs/day: 0.25    Average packs/day: 0.3 packs/day for 15.0 years (3.8 ttl pk-yrs)    Types: Cigarettes   Smokeless tobacco: Never   Tobacco comments:    5 ciggs daily  Vaping Use   Vaping status: Never Used  Substance Use Topics   Alcohol use: No   Drug use: Not Currently    Types: Marijuana     Medication list has been reviewed and updated.  No outpatient medications have been marked as taking for the 07/08/24 encounter (Orders Only) with Justus Leita DEL, MD.       01/02/2024    2:02 PM 08/28/2023    1:52 PM 05/28/2023    1:57 PM 02/20/2023    1:27 PM  GAD 7 : Generalized Anxiety Score  Nervous, Anxious, on Edge 0 2 2 2   Control/stop worrying 0 2 2 2   Worry too much - different things 0 2 2 2   Trouble relaxing 0 1 1 1   Restless 0 2 2 1   Easily annoyed or irritable 0 0 0 0  Afraid - awful might happen 0 2 2 3   Total GAD 7 Score 0 11 11 11   Anxiety Difficulty Not difficult at all  Somewhat difficult Somewhat difficult Somewhat difficult       01/02/2024    2:02 PM 08/28/2023    1:52 PM 05/28/2023    1:56 PM  Depression screen PHQ 2/9  Decreased Interest 0 1 1  Down, Depressed, Hopeless 0 1 1  PHQ - 2 Score 0 2 2  Altered sleeping 0 1 1  Tired, decreased energy 0 3 2  Change in appetite 0 3 2  Feeling bad or failure about yourself  0 0 2  Trouble concentrating 0 0 3  Moving slowly or fidgety/restless 0 0 2  Suicidal thoughts 0 0 0  PHQ-9 Score 0 9 14  Difficult doing work/chores Not difficult at all Not difficult at all Somewhat difficult    BP Readings from Last 3 Encounters:  01/02/24 112/70  08/28/23 112/72  05/28/23 120/68    Physical Exam  Wt Readings from Last 3 Encounters:  01/02/24 215 lb (97.5 kg)  08/28/23 191 lb (86.6 kg)  05/28/23 199 lb (90.3 kg)    LMP  (LMP Unknown)   Assessment and Plan:  Problem List  Items Addressed This Visit   None   No follow-ups on file.    Leita HILARIO Adie, MD [facility] [place]

## 2024-07-21 ENCOUNTER — Other Ambulatory Visit: Payer: Self-pay | Admitting: Internal Medicine

## 2024-07-21 DIAGNOSIS — E890 Postprocedural hypothyroidism: Secondary | ICD-10-CM

## 2024-07-22 NOTE — Telephone Encounter (Signed)
 Requested Prescriptions  Refused Prescriptions Disp Refills   ARMOUR THYROID  180 MG tablet [Pharmacy Med Name: ARMOUR THYROID  180 MG TABLET] 60 tablet 0    Sig: TAKE 2 TABLETS (360 MG TOTAL) BY MOUTH DAILY     Endocrinology:  Hypothyroid Agents Failed - 07/22/2024  3:14 PM      Failed - TSH in normal range and within 360 days    TSH  Date Value Ref Range Status  01/02/2024 0.196 (L) 0.450 - 4.500 uIU/mL Final         Failed - Valid encounter within last 12 months    Recent Outpatient Visits   None

## 2024-07-28 ENCOUNTER — Other Ambulatory Visit: Payer: Self-pay

## 2024-07-28 ENCOUNTER — Other Ambulatory Visit: Payer: Self-pay | Admitting: Internal Medicine

## 2024-07-28 DIAGNOSIS — F3341 Major depressive disorder, recurrent, in partial remission: Secondary | ICD-10-CM

## 2024-07-28 DIAGNOSIS — E890 Postprocedural hypothyroidism: Secondary | ICD-10-CM

## 2024-07-28 MED ORDER — THYROID 180 MG PO TABS
360.0000 mg | ORAL_TABLET | Freq: Every day | ORAL | 1 refills | Status: DC
Start: 2024-07-28 — End: 2024-09-12

## 2024-07-29 NOTE — Telephone Encounter (Signed)
 Requested medications are due for refill today.  yes  Requested medications are on the active medications list.  yes  Last refill. 05/01/2024 #90 0 rf  Future visit scheduled.   no  Notes to clinic.  Labs are expired.    Requested Prescriptions  Pending Prescriptions Disp Refills   sertraline  (ZOLOFT ) 100 MG tablet [Pharmacy Med Name: SERTRALINE  HCL 100 MG TABLET] 90 tablet 0    Sig: TAKE 1 TABLET BY MOUTH EVERY DAY     Psychiatry:  Antidepressants - SSRI - sertraline  Failed - 07/29/2024 11:01 AM      Failed - AST in normal range and within 360 days    AST  Date Value Ref Range Status  04/10/2023 15 0 - 40 IU/L Final         Failed - ALT in normal range and within 360 days    ALT  Date Value Ref Range Status  04/10/2023 11 0 - 32 IU/L Final         Failed - Valid encounter within last 6 months    Recent Outpatient Visits   None            Passed - Completed PHQ-2 or PHQ-9 in the last 360 days

## 2024-07-29 NOTE — Telephone Encounter (Signed)
Please review medication refill  request

## 2024-09-12 ENCOUNTER — Encounter: Payer: Self-pay | Admitting: Internal Medicine

## 2024-09-12 ENCOUNTER — Other Ambulatory Visit: Payer: Self-pay | Admitting: Internal Medicine

## 2024-09-12 DIAGNOSIS — E890 Postprocedural hypothyroidism: Secondary | ICD-10-CM

## 2024-09-12 MED ORDER — THYROID 180 MG PO TABS: 450.0000 mg | ORAL_TABLET | Freq: Every day | ORAL | 0 refills | Status: AC

## 2024-09-12 NOTE — Telephone Encounter (Signed)
 Please review.  KP

## 2024-09-12 NOTE — Progress Notes (Unsigned)
 Date:  09/12/2024   Name:  Brandi Dominguez   DOB:  1971-12-15   MRN:  [id number]   Chief Complaint: No chief complaint on file.  HPI  Review of Systems   Lab Results  Component Value Date   NA 142 04/10/2023   K 4.8 04/10/2023   CO2 24 04/10/2023   GLUCOSE 82 04/10/2023   BUN 18 04/10/2023   CREATININE 0.98 04/10/2023   CALCIUM  9.1 04/10/2023   EGFR 71 04/10/2023   GFRNONAA 80 10/25/2018   No results found for: CHOL, HDL, LDLCALC, LDLDIRECT, TRIG, CHOLHDL Lab Results  Component Value Date   TSH 0.196 (L) 01/02/2024   Lab Results  Component Value Date   HGBA1C 6.0 (H) 01/02/2024   Lab Results  Component Value Date   WBC 10.5 04/10/2023   HGB 13.3 04/10/2023   HCT 41.1 04/10/2023   MCV 84 04/10/2023   PLT 322 04/10/2023   Lab Results  Component Value Date   ALT 11 04/10/2023   AST 15 04/10/2023   ALKPHOS 85 04/10/2023   BILITOT <0.2 04/10/2023   Lab Results  Component Value Date   VD25OH 36.0 10/25/2020     Patient Active Problem List   Diagnosis Date Noted   Amenorrhea 01/02/2024   BMI 35.0-35.9,adult 04/10/2023   Prediabetes 12/13/2021   Tear of meniscus of right knee 05/30/2019   Panic attacks 05/30/2019   Major depressive disorder, recurrent episode, in partial remission (HCC) 10/25/2018   Tobacco use disorder 10/25/2018   Family history of premature CAD 10/25/2018   Status post total thyroidectomy 03/25/2018   Arthralgia of both hands 08/20/2017   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   Irritable bowel syndrome 09/01/2016   Personal history of peptic ulcer disease 09/01/2016   GERD (gastroesophageal reflux disease) 09/01/2016    Allergies  Allergen Reactions   Diflucan [Fluconazole] Anaphylaxis   Venlafaxine  Anxiety and Palpitations    Past Surgical History:  Procedure Laterality Date   CHOLECYSTECTOMY  2012   THYROIDECTOMY N/A 03/25/2018   Procedure: THYROIDECTOMY;  Surgeon: Edda Mt, MD;  Location: [facility] ORS;  Service:  ENT;  Laterality: N/A;   TONSILLECTOMY AND ADENOIDECTOMY      Social History   Tobacco Use   Smoking status: Every Day    Current packs/day: 0.25    Average packs/day: 0.3 packs/day for 15.0 years (3.8 ttl pk-yrs)    Types: Cigarettes   Smokeless tobacco: Never   Tobacco comments:    5 ciggs daily  Vaping Use   Vaping status: Never Used  Substance Use Topics   Alcohol use: No   Drug use: Not Currently    Types: Marijuana     Medication list has been reviewed and updated.  No outpatient medications have been marked as taking for the 09/12/24 encounter (Orders Only) with Justus Leita DEL, MD.       01/02/2024    2:02 PM 08/28/2023    1:52 PM 05/28/2023    1:57 PM 02/20/2023    1:27 PM  GAD 7 : Generalized Anxiety Score  Nervous, Anxious, on Edge 0 2 2 2   Control/stop worrying 0 2 2 2   Worry too much - different things 0 2 2 2   Trouble relaxing 0 1 1 1   Restless 0 2 2 1   Easily annoyed or irritable 0 0 0 0  Afraid - awful might happen 0 2 2 3   Total GAD 7 Score 0 11 11 11   Anxiety Difficulty Not difficult at all  Somewhat difficult Somewhat difficult Somewhat difficult       01/02/2024    2:02 PM 08/28/2023    1:52 PM 05/28/2023    1:56 PM  Depression screen PHQ 2/9  Decreased Interest 0 1 1  Down, Depressed, Hopeless 0 1 1  PHQ - 2 Score 0 2 2  Altered sleeping 0 1 1  Tired, decreased energy 0 3 2  Change in appetite 0 3 2  Feeling bad or failure about yourself  0 0 2  Trouble concentrating 0 0 3  Moving slowly or fidgety/restless 0 0 2  Suicidal thoughts 0 0 0  PHQ-9 Score 0 9 14  Difficult doing work/chores Not difficult at all Not difficult at all Somewhat difficult    BP Readings from Last 3 Encounters:  01/02/24 112/70  08/28/23 112/72  05/28/23 120/68    Physical Exam  Wt Readings from Last 3 Encounters:  01/02/24 215 lb (97.5 kg)  08/28/23 191 lb (86.6 kg)  05/28/23 199 lb (90.3 kg)    LMP  (LMP Unknown)   Assessment and Plan:  Problem List  Items Addressed This Visit   None   No follow-ups on file.    Leita HILARIO Adie, MD [facility] [place]

## 2024-12-01 ENCOUNTER — Other Ambulatory Visit: Payer: Self-pay | Admitting: Internal Medicine

## 2024-12-01 DIAGNOSIS — F3341 Major depressive disorder, recurrent, in partial remission: Secondary | ICD-10-CM

## 2024-12-02 NOTE — Telephone Encounter (Signed)
 Requested Prescriptions  Pending Prescriptions Disp Refills   sertraline  (ZOLOFT ) 100 MG tablet [Pharmacy Med Name: SERTRALINE  HCL 100 MG TABLET] 90 tablet 0    Sig: TAKE 1 TABLET BY MOUTH EVERY DAY     Psychiatry:  Antidepressants - SSRI - sertraline  Failed - 12/02/2024  3:42 PM      Failed - AST in normal range and within 360 days    AST  Date Value Ref Range Status  04/10/2023 15 0 - 40 IU/L Final         Failed - ALT in normal range and within 360 days    ALT  Date Value Ref Range Status  04/10/2023 11 0 - 32 IU/L Final         Failed - Valid encounter within last 6 months    Recent Outpatient Visits   None            Passed - Completed PHQ-2 or PHQ-9 in the last 360 days
